# Patient Record
Sex: Female | Born: 1970
Health system: Southern US, Community
[De-identification: ages and names within clinical notes are randomized; demographics above are authoritative.]

## PROBLEM LIST (undated history)

## (undated) DIAGNOSIS — I10 Essential (primary) hypertension: Secondary | ICD-10-CM

## (undated) DIAGNOSIS — E7800 Pure hypercholesterolemia, unspecified: Secondary | ICD-10-CM

## (undated) HISTORY — PX: CHOLECYSTECTOMY: SHX55

## (undated) HISTORY — PX: ABDOMINAL HYSTERECTOMY: SHX81

## (undated) HISTORY — PX: TUBAL LIGATION: SHX77

---

## 2009-11-20 ENCOUNTER — Emergency Department (HOSPITAL_BASED_OUTPATIENT_CLINIC_OR_DEPARTMENT_OTHER): Admission: EM | Admit: 2009-11-20 | Discharge: 2009-11-21 | Payer: Self-pay | Admitting: Emergency Medicine

## 2011-04-02 ENCOUNTER — Emergency Department (HOSPITAL_BASED_OUTPATIENT_CLINIC_OR_DEPARTMENT_OTHER)
Admission: EM | Admit: 2011-04-02 | Discharge: 2011-04-02 | Disposition: A | Discharge status: Home / Self Care | Payer: BC Managed Care – PPO | Attending: Emergency Medicine | Admitting: Emergency Medicine

## 2011-04-02 ENCOUNTER — Encounter (HOSPITAL_BASED_OUTPATIENT_CLINIC_OR_DEPARTMENT_OTHER): Payer: Self-pay | Admitting: *Deleted

## 2011-04-02 ENCOUNTER — Emergency Department (INDEPENDENT_AMBULATORY_CARE_PROVIDER_SITE_OTHER): Payer: BC Managed Care – PPO

## 2011-04-02 DIAGNOSIS — IMO0001 Reserved for inherently not codable concepts without codable children: Secondary | ICD-10-CM | POA: Insufficient documentation

## 2011-04-02 DIAGNOSIS — R52 Pain, unspecified: Secondary | ICD-10-CM

## 2011-04-02 DIAGNOSIS — I10 Essential (primary) hypertension: Secondary | ICD-10-CM | POA: Insufficient documentation

## 2011-04-02 DIAGNOSIS — R5381 Other malaise: Secondary | ICD-10-CM

## 2011-04-02 HISTORY — DX: Essential (primary) hypertension: I10

## 2011-04-02 LAB — BASIC METABOLIC PANEL
BUN: 9 mg/dL (ref 6–23)
Calcium: 10 mg/dL (ref 8.4–10.5)
GFR calc Af Amer: >90 mL/min (ref >90)
GFR calc non Af Amer: >90 mL/min (ref >90)
Glucose, Bld: 96 mg/dL (ref 70–99)
Potassium: 3.8 mEq/L (ref 3.5–5.1)
Sodium: 139 mEq/L (ref 135–145)

## 2011-04-02 LAB — URINALYSIS, ROUTINE W REFLEX MICROSCOPIC
Bilirubin Urine: NEGATIVE
Hgb urine dipstick: NEGATIVE
Nitrite: NEGATIVE
Protein, ur: NEGATIVE mg/dL
Specific Gravity, Urine: 1.016 (ref 1.005–1.030)
Urobilinogen, UA: 0.2 mg/dL (ref 0.0–1.0)

## 2011-04-02 LAB — CBC
Hemoglobin: 13.6 g/dL (ref 12.0–15.0)
MCH: 29.6 pg (ref 26.0–34.0)
MCHC: 33.4 g/dL (ref 30.0–36.0)
RDW: 13.4 % (ref 11.5–15.5)

## 2011-04-02 MED ORDER — HYDROCHLOROTHIAZIDE 25 MG PO TABS: 25.0000 mg | ORAL_TABLET | Freq: Every day | ORAL | Status: DC

## 2011-04-02 MED ORDER — OXYCODONE-ACETAMINOPHEN 5-325 MG PO TABS
1.0000 | ORAL_TABLET | Freq: Once | ORAL | Status: AC
  Administered 2011-04-02: 1 via ORAL
  Filled 2011-04-02: qty 1

## 2011-04-02 NOTE — ED Provider Notes (Signed)
History     CSN: [id number]  Arrival date & time 04/02/11  1950   First MD Initiated Contact with Patient 04/02/11 1959      Chief Complaint  Patient presents with  . Generalized Body Aches  . Joint Swelling    (Consider location/radiation/quality/duration/timing/severity/associated sxs/prior treatment) HPI Comments: Pt states that she has been having generalized myalgias and extremity swelling for the last day:pt states that she has a history of extremity swelling in the past, and it happened when her bp was up:pt states that she hasn't been on bp medications in a months, because her pcp took her off them, because it was normal the last time that she was seen:pt denies cp, fever, n/v/d, cough, dysuria, or sob:pt has not tried anything at  home  The history is provided by the patient. No language interpreter was used.    Past Medical History  Diagnosis Date  . Hypertension     Past Surgical History  Procedure Date  . Tubal ligation   . Abdominal hysterectomy     History reviewed. No pertinent family history.  History  Substance Use Topics  . Smoking status: Never Smoker   . Smokeless tobacco: Not on file  . Alcohol Use: No    OB History    Grav Para Term Preterm Abortions TAB SAB Ect Mult Living                  Review of Systems  All other systems reviewed and are negative.    Allergies  Aspirin  Home Medications  No current outpatient prescriptions on file.  BP 139/89  Pulse 84  Temp(Src) 98.4 F (36.9 C) (Oral)  Resp 16  Ht 4\' 11"  (1.499 m)  Wt 220 lb (99.791 kg)  BMI 44.43 kg/m2  SpO2 100%  Physical Exam  Nursing note and vitals reviewed. Constitutional: She is oriented to person, place, and time. She appears well-developed and well-nourished.  HENT:  Head: Normocephalic and atraumatic.  Right Ear: External ear normal.  Left Ear: External ear normal.  Eyes: Conjunctivae and EOM are normal. Pupils are equal, round, and reactive to light.    Neck: Neck supple.  Cardiovascular: Normal rate and regular rhythm.   Pulmonary/Chest: Effort normal and breath sounds normal.  Abdominal: Soft. Bowel sounds are normal.  Musculoskeletal: Normal range of motion.       Mild swelling noted to hands bilaterally:no lower extremity swelling noted  Neurological: She is alert and oriented to person, place, and time.  Skin: Skin is warm and dry.  Psychiatric: She has a normal mood and affect.    ED Course  Procedures (including critical care time)   Labs Reviewed  CBC  BASIC METABOLIC PANEL  URINALYSIS, ROUTINE W REFLEX MICROSCOPIC  PREGNANCY, URINE   Dg Chest 2 View  04/02/2011  *RADIOLOGY REPORT*  Clinical Data: Bodyaches, myalgia, weakness  CHEST - 2 VIEW  Comparison: None.  Findings: Normal cardiac silhouette and mediastinal contours.  No focal airspace opacities.  No pleural effusion or pneumothorax.  No acute osseous abnormalities.  IMPRESSION: No acute cardiopulmonary disease.  Specifically, no evidence of pneumonia.  Original Report Authenticated By: Waynard Reeds, M.D.     1. Hypertension   2. Body aches       MDM  No acute findings here:will start pt on bp medications        Teressa Lower, NP 04/02/11 2151

## 2011-04-02 NOTE — Discharge Instructions (Signed)
Hypertension Hypertension is another name for high blood pressure. High blood pressure may mean that your heart needs to work harder to pump blood. Blood pressure consists of two numbers, which includes a higher number over a lower number (example: 110/72). HOME CARE   Make lifestyle changes as told by your doctor. This may include weight loss and exercise.   Take your blood pressure medicine every day.   Limit how much salt you use.   Stop smoking if you smoke.   Do not use drugs.   Talk to your doctor if you are using decongestants or birth control pills. These medicines might make blood pressure higher.   Females should not drink more than 1 alcoholic drink per day. Males should not drink more than 2 alcoholic drinks per day.   See your doctor as told.  GET HELP RIGHT AWAY IF:   You have a blood pressure reading with a top number of 180 or higher.   You get a very bad headache.   You get blurred or changing vision.   You feel confused.   You feel weak, numb, or faint.   You get chest or belly (abdominal) pain.   You throw up (vomit).   You cannot breathe very well.  MAKE SURE YOU:   Understand these instructions.   Will watch your condition.   Will get help right away if you are not doing well or get worse.  Document Released: 07/10/2007 Document Revised: 10/03/2010 Document Reviewed: 07/10/2007 [employer] Patient Information 2012 [employer]. 

## 2011-04-02 NOTE — ED Notes (Signed)
Pt c/o generalized body aches and extr swelling x 1 day

## 2011-04-04 NOTE — ED Provider Notes (Signed)
Medical screening examination/treatment/procedure(s) were performed by non-physician practitioner and as supervising physician I was immediately available for consultation/collaboration.  Cyndra Numbers, MD 04/04/11 1000

## 2011-07-06 ENCOUNTER — Encounter (HOSPITAL_BASED_OUTPATIENT_CLINIC_OR_DEPARTMENT_OTHER): Payer: Self-pay | Admitting: *Deleted

## 2011-07-06 ENCOUNTER — Emergency Department (HOSPITAL_BASED_OUTPATIENT_CLINIC_OR_DEPARTMENT_OTHER)
Admission: EM | Admit: 2011-07-06 | Discharge: 2011-07-06 | Disposition: A | Discharge status: Home / Self Care | Payer: BC Managed Care – PPO | Attending: Emergency Medicine | Admitting: Emergency Medicine

## 2011-07-06 ENCOUNTER — Emergency Department (HOSPITAL_BASED_OUTPATIENT_CLINIC_OR_DEPARTMENT_OTHER): Payer: BC Managed Care – PPO

## 2011-07-06 DIAGNOSIS — R609 Edema, unspecified: Secondary | ICD-10-CM | POA: Insufficient documentation

## 2011-07-06 DIAGNOSIS — E669 Obesity, unspecified: Secondary | ICD-10-CM | POA: Insufficient documentation

## 2011-07-06 DIAGNOSIS — M25531 Pain in right wrist: Secondary | ICD-10-CM

## 2011-07-06 DIAGNOSIS — R0602 Shortness of breath: Secondary | ICD-10-CM | POA: Insufficient documentation

## 2011-07-06 DIAGNOSIS — M79609 Pain in unspecified limb: Secondary | ICD-10-CM | POA: Insufficient documentation

## 2011-07-06 DIAGNOSIS — I10 Essential (primary) hypertension: Secondary | ICD-10-CM | POA: Insufficient documentation

## 2011-07-06 DIAGNOSIS — M25539 Pain in unspecified wrist: Secondary | ICD-10-CM | POA: Insufficient documentation

## 2011-07-06 DIAGNOSIS — M79662 Pain in left lower leg: Secondary | ICD-10-CM

## 2011-07-06 LAB — CBC
HCT: 35.9 % — ABNORMAL LOW (ref 36.0–46.0)
MCHC: 34 g/dL (ref 30.0–36.0)
MCV: 89.8 fL (ref 78.0–100.0)
Platelets: 275 10*3/uL (ref 150–400)
RDW: 13.6 % (ref 11.5–15.5)

## 2011-07-06 LAB — URINALYSIS, ROUTINE W REFLEX MICROSCOPIC
Glucose, UA: NEGATIVE mg/dL
Leukocytes, UA: NEGATIVE
Protein, ur: NEGATIVE mg/dL
Specific Gravity, Urine: 1.015 (ref 1.005–1.030)
pH: 6 (ref 5.0–8.0)

## 2011-07-06 LAB — DIFFERENTIAL
Basophils Absolute: 0 10*3/uL (ref 0.0–0.1)
Basophils Relative: 0 % (ref 0–1)
Eosinophils Absolute: 0.1 10*3/uL (ref 0.0–0.7)
Eosinophils Relative: 2 % (ref 0–5)
Monocytes Absolute: 0.4 10*3/uL (ref 0.1–1.0)

## 2011-07-06 LAB — COMPREHENSIVE METABOLIC PANEL
AST: 15 U/L (ref 0–37)
Albumin: 4 g/dL (ref 3.5–5.2)
Calcium: 9.4 mg/dL (ref 8.4–10.5)
Creatinine, Ser: 0.6 mg/dL (ref 0.50–1.10)
GFR calc non Af Amer: >90 mL/min (ref >90)
Sodium: 139 mEq/L (ref 135–145)
Total Protein: 7.3 g/dL (ref 6.0–8.3)

## 2011-07-06 LAB — PRO B NATRIURETIC PEPTIDE: Pro B Natriuretic peptide (BNP): 99.3 pg/mL (ref 0–125)

## 2011-07-06 MED ORDER — MORPHINE SULFATE 4 MG/ML IJ SOLN
4.0000 mg | Freq: Once | INTRAMUSCULAR | Status: AC
  Administered 2011-07-06: 4 mg via INTRAVENOUS

## 2011-07-06 MED ORDER — MORPHINE SULFATE 4 MG/ML IJ SOLN
INTRAMUSCULAR | Status: AC
  Filled 2011-07-06: qty 1

## 2011-07-06 MED ORDER — OXYCODONE-ACETAMINOPHEN 5-325 MG PO TABS
1.0000 | ORAL_TABLET | Freq: Once | ORAL | Status: AC
  Administered 2011-07-06: 1 via ORAL
  Filled 2011-07-06: qty 1

## 2011-07-06 MED ORDER — PREDNISONE 50 MG PO TABS: 50.0000 mg | ORAL_TABLET | Freq: Every day | ORAL | Status: DC

## 2011-07-06 MED ORDER — OXYCODONE-ACETAMINOPHEN 5-325 MG PO TABS: 1.0000 | ORAL_TABLET | ORAL | Status: DC | PRN

## 2011-07-06 MED ORDER — ONDANSETRON HCL 4 MG/2ML IJ SOLN
INTRAMUSCULAR | Status: AC
  Administered 2011-07-06: 4 mg
  Filled 2011-07-06: qty 2

## 2011-07-06 MED ORDER — OXYCODONE-ACETAMINOPHEN 5-325 MG PO TABS: 1.0000 | ORAL_TABLET | ORAL | Status: AC | PRN

## 2011-07-06 MED ORDER — PREDNISONE 50 MG PO TABS
60.0000 mg | ORAL_TABLET | Freq: Once | ORAL | Status: AC
  Administered 2011-07-06: 60 mg via ORAL
  Filled 2011-07-06: qty 1

## 2011-07-06 NOTE — ED Notes (Signed)
Pt reports leg swelling and hand swelling x 1 week- headache today

## 2011-07-06 NOTE — ED Notes (Signed)
Assigned RN at bedside

## 2011-07-06 NOTE — ED Notes (Signed)
Patient transported to Ultrasound via stretcher 

## 2011-07-06 NOTE — Discharge Instructions (Signed)
Your testing did not show an obvious cause for your pain and swelling. I suspect that it may be due 2 a cousin of rheumatoid arthritis or lupus. This will require additional testing which I cannot do from the emergency department. Please make a followup appointment with your Dr. or may do some additional blood tests from the office, or may refer you to a rheumatologist.  Prednisone tablets What is this medicine? PREDNISONE (PRED ni sone) is a corticosteroid. It is commonly used to treat inflammation of the skin, joints, lungs, and other organs. Common conditions treated include asthma, allergies, and arthritis. It is also used for other conditions, such as blood disorders and diseases of the adrenal glands. This medicine may be used for other purposes; ask your health care provider or pharmacist if you have questions. What should I tell my health care provider before I take this medicine? They need to know if you have any of these conditions: -Cushing's syndrome -diabetes -glaucoma -heart disease -high blood pressure -infection (especially a virus infection such as chickenpox, cold sores, or herpes) -kidney disease -liver disease -mental illness -myasthenia gravis -osteoporosis -seizures -stomach or intestine problems -thyroid disease -an unusual or allergic reaction to lactose, prednisone, other medicines, foods, dyes, or preservatives -pregnant or trying to get pregnant -breast-feeding How should I use this medicine? Take this medicine by mouth with a glass of water. Follow the directions on the prescription label. Take this medicine with food. If you are taking this medicine once a day, take it in the morning. Do not take more medicine than you are told to take. Do not suddenly stop taking your medicine because you may develop a severe reaction. Your doctor will tell you how much medicine to take. If your doctor wants you to stop the medicine, the dose may be slowly lowered over time to  avoid any side effects. Talk to your pediatrician regarding the use of this medicine in children. Special care may be needed. Overdosage: If you think you have taken too much of this medicine contact a poison control center or emergency room at once. NOTE: This medicine is only for you. Do not share this medicine with others. What if I miss a dose? If you miss a dose, take it as soon as you can. If it is almost time for your next dose, talk to your doctor or health care professional. You may need to miss a dose or take an extra dose. Do not take double or extra doses without advice. What may interact with this medicine? Do not take this medicine with any of the following medications: -metyrapone -mifepristone This medicine may also interact with the following medications: -aminoglutethimide -amphotericin B -aspirin and aspirin-like medicines -barbiturates -certain medicines for diabetes, like glipizide or glyburide -cholestyramine -cholinesterase inhibitors -cyclosporine -digoxin -diuretics -ephedrine -female hormones, like estrogens and birth control pills -isoniazid -ketoconazole -NSAIDS, medicines for pain and inflammation, like ibuprofen or naproxen -phenytoin -rifampin -toxoids -vaccines -warfarin This list may not describe all possible interactions. Give your health care provider a list of all the medicines, herbs, non-prescription drugs, or dietary supplements you use. Also tell them if you smoke, drink alcohol, or use illegal drugs. Some items may interact with your medicine. What should I watch for while using this medicine? Visit your doctor or health care professional for regular checks on your progress. If you are taking this medicine over a prolonged period, carry an identification card with your name and address, the type and dose of your medicine,  and your doctor's name and address. This medicine may increase your risk of getting an infection. Tell your doctor or  health care professional if you are around anyone with measles or chickenpox, or if you develop sores or blisters that do not heal properly. If you are going to have surgery, tell your doctor or health care professional that you have taken this medicine within the last twelve months. Ask your doctor or health care professional about your diet. You may need to lower the amount of salt you eat. This medicine may affect blood sugar levels. If you have diabetes, check with your doctor or health care professional before you change your diet or the dose of your diabetic medicine. What side effects may I notice from receiving this medicine? Side effects that you should report to your doctor or health care professional as soon as possible: -allergic reactions like skin rash, itching or hives, swelling of the face, lips, or tongue -changes in emotions or moods -changes in vision -depressed mood -eye pain -fever or chills, cough, sore throat, pain or difficulty passing urine -increased thirst -swelling of ankles, feet Side effects that usually do not require medical attention (report to your doctor or health care professional if they continue or are bothersome): -confusion, excitement, restlessness -headache -nausea, vomiting -skin problems, acne, thin and shiny skin -trouble sleeping -weight gain This list may not describe all possible side effects. Call your doctor for medical advice about side effects. You may report side effects to FDA at 1-800-FDA-1088. Where should I keep my medicine? Keep out of the reach of children. Store at room temperature between 15 and 30 degrees C (59 and 86 degrees F). Protect from light. Keep container tightly closed. Throw away any unused medicine after the expiration date. NOTE: This sheet is a summary. It may not cover all possible information. If you have questions about this medicine, talk to your doctor, pharmacist, or health care provider.  2012, [employer]. (09/06/2010 10:57:14 AM)  Acetaminophen; Oxycodone tablets What is this medicine? ACETAMINOPHEN; OXYCODONE (a set a MEE noe fen; ox i KOE done) is a pain reliever. It is used to treat mild to moderate pain. This medicine may be used for other purposes; ask your health care provider or pharmacist if you have questions. What should I tell my health care provider before I take this medicine? They need to know if you have any of these conditions: -brain tumor -Crohn's disease, inflammatory bowel disease, or ulcerative colitis -drink more than 3 alcohol containing drinks per day -drug abuse or addiction -head injury -heart or circulation problems -kidney disease or problems going to the bathroom -liver disease -lung disease, asthma, or breathing problems -an unusual or allergic reaction to acetaminophen, oxycodone, other opioid analgesics, other medicines, foods, dyes, or preservatives -pregnant or trying to get pregnant -breast-feeding How should I use this medicine? Take this medicine by mouth with a full glass of water. Follow the directions on the prescription label. Take your medicine at regular intervals. Do not take your medicine more often than directed. Talk to your pediatrician regarding the use of this medicine in children. Special care may be needed. Patients over 28 years old may have a stronger reaction and need a smaller dose. Overdosage: If you think you have taken too much of this medicine contact a poison control center or emergency room at once. NOTE: This medicine is only for you. Do not share this medicine with others. What if I miss a dose? If  you miss a dose, take it as soon as you can. If it is almost time for your next dose, take only that dose. Do not take double or extra doses. What may interact with this medicine? -alcohol or medicines that contain alcohol -antihistamines -barbiturates like amobarbital, butalbital, butabarbital, methohexital,  pentobarbital, phenobarbital, thiopental, and secobarbital -benztropine -drugs for bladder problems like solifenacin, trospium, oxybutynin, tolterodine, hyoscyamine, and methscopolamine -drugs for breathing problems like ipratropium and tiotropium -drugs for certain stomach or intestine problems like propantheline, homatropine methylbromide, glycopyrrolate, atropine, belladonna, and dicyclomine -general anesthetics like etomidate, ketamine, nitrous oxide, propofol, desflurane, enflurane, halothane, isoflurane, and sevoflurane -medicines for depression, anxiety, or psychotic disturbances -medicines for pain like codeine, morphine, pentazocine, buprenorphine, butorphanol, nalbuphine, tramadol, and propoxyphene -medicines for sleep -muscle relaxants -naltrexone -phenothiazines like perphenazine, thioridazine, chlorpromazine, mesoridazine, fluphenazine, prochlorperazine, promazine, and trifluoperazine -scopolamine -trihexyphenidyl This list may not describe all possible interactions. Give your health care provider a list of all the medicines, herbs, non-prescription drugs, or dietary supplements you use. Also tell them if you smoke, drink alcohol, or use illegal drugs. Some items may interact with your medicine. What should I watch for while using this medicine? Tell your doctor or health care professional if your pain does not go away, if it gets worse, or if you have new or a different type of pain. You may develop tolerance to the medicine. Tolerance means that you will need a higher dose of the medication for pain relief. Tolerance is normal and is expected if you take this medicine for a long time. Do not suddenly stop taking your medicine because you may develop a severe reaction. Your body becomes used to the medicine. This does NOT mean you are addicted. Addiction is a behavior related to getting and using a drug for a nonmedical reason. If you have pain, you have a medical reason to take pain  medicine. Your doctor will tell you how much medicine to take. If your doctor wants you to stop the medicine, the dose will be slowly lowered over time to avoid any side effects. You may get drowsy or dizzy. Do not drive, use machinery, or do anything that needs mental alertness until you know how this medicine affects you. Do not stand or sit up quickly, especially if you are an older patient. This reduces the risk of dizzy or fainting spells. Alcohol may interfere with the effect of this medicine. Avoid alcoholic drinks. The medicine will cause constipation. Try to have a bowel movement at least every 2 to 3 days. If you do not have a bowel movement for 3 days, call your doctor or health care professional. Do not take Tylenol (acetaminophen) or medicines that have acetaminophen with this medicine. Too much acetaminophen can be very dangerous. Many nonprescription medicines contain acetaminophen. Always read the labels carefully to avoid taking more acetaminophen. What side effects may I notice from receiving this medicine? Side effects that you should report to your doctor or health care professional as soon as possible: -allergic reactions like skin rash, itching or hives, swelling of the face, lips, or tongue -breathing difficulties, wheezing -confusion -light headedness or fainting spells -severe stomach pain -yellowing of the skin or the whites of the eyes Side effects that usually do not require medical attention (report to your doctor or health care professional if they continue or are bothersome): -dizziness -drowsiness -nausea -vomiting This list may not describe all possible side effects. Call your doctor for medical advice about side effects. You may report  side effects to FDA at 1-800-FDA-1088. Where should I keep my medicine? Keep out of the reach of children. This medicine can be abused. Keep your medicine in a safe place to protect it from theft. Do not share this medicine with  anyone. Selling or giving away this medicine is dangerous and against the law. Store at room temperature between 20 and 25 degrees C (68 and 77 degrees F). Keep container tightly closed. Protect from light. Flush any unused medicines down the toilet. Do not use the medicine after the expiration date. NOTE: This sheet is a summary. It may not cover all possible information. If you have questions about this medicine, talk to your doctor, pharmacist, or health care provider.  2012, [employer]. (12/21/2007 10:01:21 AM)

## 2011-07-06 NOTE — ED Notes (Signed)
Patient complains of nausea while having ECG done. Assigned RN made aware.

## 2011-07-06 NOTE — ED Provider Notes (Signed)
History   This chart was scribed for Brooke Booze, MD by Brooks Sailors. The patient was seen in room MH08/MH08. Patient's care was started at 1442.   CSN: [id number]  Arrival date & time 07/06/11  1442   First MD Initiated Contact with Patient 07/06/11 1607      Chief Complaint  Patient presents with  . Leg Swelling    (Consider location/radiation/quality/duration/timing/severity/associated sxs/prior treatment) Patient is a 41 y.o. female presenting with leg pain.  Leg Pain  The incident occurred 12 to 24 hours ago. There was no injury mechanism. The pain is present in the left leg. The quality of the pain is described as sharp. The pain is at a severity of 9/10. The pain is moderate. The pain has been constant since onset. She reports no foreign bodies present. The symptoms are aggravated by activity. She has tried nothing for the symptoms. The treatment provided no relief.    Brooke Trujillo is a 41 y.o. female who presents to the Emergency Department complaining of left leg and right hand swelling and pain with associated dull headaches and nausea onset one week ago. Pt came to ER because the pain increased severely last night describing pain as sharp and shooting. Pt says her pain currently is a 9. Pt says she took Tramadol for relief which put her to sleep. Pt says she is SOB for about of month, but thinks it may be her weight. Exertion and talking aggravate SOB. Denies Fever, chills, diaphoresis, Chest pain, smoking. Pt with history of HTN, and allergic to ASA.    Past Medical History  Diagnosis Date  . Hypertension     Past Surgical History  Procedure Date  . Tubal ligation   . Abdominal hysterectomy     No family history on file.  History  Substance Use Topics  . Smoking status: Never Smoker   . Smokeless tobacco: Never Used  . Alcohol Use: No    OB History    Grav Para Term Preterm Abortions TAB SAB Ect Mult Living                  Review of Systems  All  other systems reviewed and are negative.    Allergies  Aspirin  Home Medications   Current Outpatient Rx  Name Route Sig Dispense Refill  . HYDROCHLOROTHIAZIDE 25 MG PO TABS Oral Take 1 tablet (25 mg total) by mouth daily. 30 tablet 0  . TRAMADOL HCL 50 MG PO TABS Oral Take 50-100 mg by mouth every 6 (six) hours as needed. For pain      BP 112/62  Pulse 84  Temp(Src) 98.3 F (36.8 C) (Oral)  Resp 16  Ht 4\' 11"  (1.499 m)  Wt 225 lb (102.059 kg)  BMI 45.44 kg/m2  SpO2 99%  Physical Exam  Nursing note and vitals reviewed. Constitutional: She is oriented to person, place, and time. She appears well-developed and well-nourished. No distress.       obese  HENT:  Head: Normocephalic and atraumatic.  Eyes: EOM are normal. Pupils are equal, round, and reactive to light.  Neck: Neck supple. No tracheal deviation present.  Cardiovascular: Normal rate.   Pulmonary/Chest: Effort normal. No respiratory distress.  Abdominal: Soft. She exhibits no distension.  Musculoskeletal: Normal range of motion. She exhibits edema and tenderness.       1+ pretibial edema BLE. Moderate tenderness to the left calf. No cords, no warmth, no erythema, no difference in calf circumference.  Neurological: She is alert and oriented to person, place, and time. No sensory deficit.       Slight decreased pin prick sensation in right hand.  Skin: Skin is warm and dry.  Psychiatric: She has a normal mood and affect. Her behavior is normal.    ED Course  Procedures (including critical care time) DIAGNOSTIC STUDIES: Oxygen Saturation is 99% on room air, normal by my interpretation.    COORDINATION OF CARE:  1613 Patient informed of current plan for treatment and evaluation and agrees with plan at this time. Pt to have chest x-ray, EKG, UA, blood work, and doppler test in leg. 1835 Pt informed of lab results.    Results for orders placed during the hospital encounter of 07/06/11  CBC      Component  Value Range   WBC 6.2  4.0 - 10.5 (K/uL)   RBC 4.00  3.87 - 5.11 (MIL/uL)   Hemoglobin 12.2  12.0 - 15.0 (g/dL)   HCT 13.0 (*) 86.5 - 46.0 (%)   MCV 89.8  78.0 - 100.0 (fL)   MCH 30.5  26.0 - 34.0 (pg)   MCHC 34.0  30.0 - 36.0 (g/dL)   RDW 78.4  69.6 - 29.5 (%)   Platelets 275  150 - 400 (K/uL)  DIFFERENTIAL      Component Value Range   Neutrophils Relative 46  43 - 77 (%)   Neutro Abs 2.8  1.7 - 7.7 (K/uL)   Lymphocytes Relative 45  12 - 46 (%)   Lymphs Abs 2.8  0.7 - 4.0 (K/uL)   Monocytes Relative 7  3 - 12 (%)   Monocytes Absolute 0.4  0.1 - 1.0 (K/uL)   Eosinophils Relative 2  0 - 5 (%)   Eosinophils Absolute 0.1  0.0 - 0.7 (K/uL)   Basophils Relative 0  0 - 1 (%)   Basophils Absolute 0.0  0.0 - 0.1 (K/uL)  COMPREHENSIVE METABOLIC PANEL      Component Value Range   Sodium 139  135 - 145 (mEq/L)   Potassium 3.5  3.5 - 5.1 (mEq/L)   Chloride 102  96 - 112 (mEq/L)   CO2 28  19 - 32 (mEq/L)   Glucose, Bld 92  70 - 99 (mg/dL)   BUN 9  6 - 23 (mg/dL)   Creatinine, Ser 2.84  0.50 - 1.10 (mg/dL)   Calcium 9.4  8.4 - 13.2 (mg/dL)   Total Protein 7.3  6.0 - 8.3 (g/dL)   Albumin 4.0  3.5 - 5.2 (g/dL)   AST 15  0 - 37 (U/L)   ALT 14  0 - 35 (U/L)   Alkaline Phosphatase 64  39 - 117 (U/L)   Total Bilirubin 0.3  0.3 - 1.2 (mg/dL)   GFR calc non Af Amer >90  >90 (mL/min)   GFR calc Af Amer >90  >90 (mL/min)  SEDIMENTATION RATE      Component Value Range   Sed Rate 38 (*) 0 - 22 (mm/hr)  URINALYSIS, ROUTINE W REFLEX MICROSCOPIC      Component Value Range   Color, Urine YELLOW  YELLOW    APPearance CLEAR  CLEAR    Specific Gravity, Urine 1.015  1.005 - 1.030    pH 6.0  5.0 - 8.0    Glucose, UA NEGATIVE  NEGATIVE (mg/dL)   Hgb urine dipstick NEGATIVE  NEGATIVE    Bilirubin Urine NEGATIVE  NEGATIVE    Ketones, ur NEGATIVE  NEGATIVE (mg/dL)  Protein, ur NEGATIVE  NEGATIVE (mg/dL)   Urobilinogen, UA 0.2  0.0 - 1.0 (mg/dL)   Nitrite NEGATIVE  NEGATIVE    Leukocytes, UA  NEGATIVE  NEGATIVE   PRO B NATRIURETIC PEPTIDE      Component Value Range   Pro B Natriuretic peptide (BNP) 99.3  0 - 125 (pg/mL)   Dg Chest 2 View  07/06/2011  *RADIOLOGY REPORT*  Clinical Data: Leg swelling.  Shortness of breath.  CHEST - 2 VIEW  Comparison: 04/04/2011  Findings: Heart and mediastinal contours are within normal limits. No focal opacities or effusions.  No acute bony abnormality.  IMPRESSION: No active cardiopulmonary disease.  Original Report Authenticated By: Cyndie Chime, M.D.   US Venous Img Lower Unilateral Left  07/06/2011  *RADIOLOGY REPORT*  Clinical Data: Left leg pain and swelling.  LEFT LOWER EXTREMITY VENOUS DUPLEX ULTRASOUND  Technique:  Gray-scale sonography with graded compression, as well as color Doppler and duplex ultrasound were performed to evaluate the deep venous system of the lower extremity from the level of the common femoral vein through the popliteal and proximal calf veins. Spectral Doppler was utilized to evaluate flow at rest and with distal augmentation maneuvers.  Comparison:  None.  Findings:  Normal compressibility of the common femoral, superficial femoral, and popliteal veins is demonstrated, as well as the visualized proximal calf veins.  No filling defects to suggest DVT on grayscale or color Doppler imaging.  Doppler waveforms show normal direction of venous flow, normal respiratory phasicity and response to augmentation.  IMPRESSION: No evidence of left lower extremity deep vein thrombosis.  Original Report Authenticated By: Darrol Angel, M.D.      ECG shows normal sinus rhythm with a rate of 75, no ectopy. Normal axis. Normal P wave. Normal QRS. Normal intervals. Normal ST and T waves. Impression: normal ECG. No old ECG available for comparison.   1. Pain, wrist, right   2. Pain of left calf   3. Edema       MDM  Confusing history. She will be sent for Doppler testing to rule out DVT. However with these complaints, I suspect that  she probably has some variation on an autoimmune disorder. Screening labs have been ordered. With the history of dyspnea on exertion, chest x-ray and ECG and BNP he will be ordered.  Laboratory workup is essentially unremarkable and Doppler study is negative for DVT. She'll be given in. Course of prednisone, given Percocet for pain control, and referred back to her PCP for further evaluation.    I personally performed the services described in this documentation, which was scribed in my presence. The recorded information has been reviewed and considered.      Brooke Booze, MD 07/06/11 [id number]

## 2011-07-06 NOTE — ED Notes (Signed)
Family at bedside. 

## 2011-08-23 DIAGNOSIS — K219 Gastro-esophageal reflux disease without esophagitis: Secondary | ICD-10-CM | POA: Insufficient documentation

## 2011-11-14 ENCOUNTER — Encounter (HOSPITAL_BASED_OUTPATIENT_CLINIC_OR_DEPARTMENT_OTHER): Payer: Self-pay | Admitting: *Deleted

## 2011-11-14 ENCOUNTER — Emergency Department (HOSPITAL_BASED_OUTPATIENT_CLINIC_OR_DEPARTMENT_OTHER): Payer: BC Managed Care – PPO

## 2011-11-14 ENCOUNTER — Emergency Department (HOSPITAL_BASED_OUTPATIENT_CLINIC_OR_DEPARTMENT_OTHER)
Admission: EM | Admit: 2011-11-14 | Discharge: 2011-11-14 | Disposition: A | Discharge status: Home / Self Care | Payer: BC Managed Care – PPO | Attending: Emergency Medicine | Admitting: Emergency Medicine

## 2011-11-14 DIAGNOSIS — H53149 Visual discomfort, unspecified: Secondary | ICD-10-CM | POA: Insufficient documentation

## 2011-11-14 DIAGNOSIS — R110 Nausea: Secondary | ICD-10-CM | POA: Insufficient documentation

## 2011-11-14 DIAGNOSIS — IMO0002 Reserved for concepts with insufficient information to code with codable children: Secondary | ICD-10-CM | POA: Insufficient documentation

## 2011-11-14 DIAGNOSIS — Z79899 Other long term (current) drug therapy: Secondary | ICD-10-CM | POA: Insufficient documentation

## 2011-11-14 DIAGNOSIS — I10 Essential (primary) hypertension: Secondary | ICD-10-CM | POA: Insufficient documentation

## 2011-11-14 DIAGNOSIS — R51 Headache: Secondary | ICD-10-CM

## 2011-11-14 MED ORDER — METOCLOPRAMIDE HCL 5 MG/ML IJ SOLN
10.0000 mg | Freq: Once | INTRAMUSCULAR | Status: AC
  Administered 2011-11-14: 10 mg via INTRAMUSCULAR
  Filled 2011-11-14: qty 2

## 2011-11-14 MED ORDER — KETOROLAC TROMETHAMINE 60 MG/2ML IM SOLN
60.0000 mg | Freq: Once | INTRAMUSCULAR | Status: AC
  Administered 2011-11-14: 60 mg via INTRAMUSCULAR
  Filled 2011-11-14: qty 2

## 2011-11-14 MED ORDER — DIPHENHYDRAMINE HCL 50 MG/ML IJ SOLN
25.0000 mg | Freq: Once | INTRAMUSCULAR | Status: AC
  Administered 2011-11-14: 50 mg via INTRAMUSCULAR
  Filled 2011-11-14: qty 1

## 2011-11-14 NOTE — ED Provider Notes (Signed)
History     CSN: [id number]  Arrival date & time 11/14/11  1719   First MD Initiated Contact with Patient 11/14/11 1840      Chief Complaint  Patient presents with  . Headache    (Consider location/radiation/quality/duration/timing/severity/associated sxs/prior treatment) HPI Comments: Patient presents with gradual onset headache that started this morning as become more severe throughout the day. Is associated with facial pain, blurry vision, photophobia, nausea. She has a history of headaches and this is similar. She denies history of migraine. She denies thunderclap onset, vomiting, chills, fever. No weakness, numbness or tingling. She denies excessive salivation, tearing or rhinorrhea. She took ibuprofen without relief.  The history is provided by the patient.    Past Medical History  Diagnosis Date  . Hypertension     Past Surgical History  Procedure Date  . Tubal ligation   . Abdominal hysterectomy     No family history on file.  History  Substance Use Topics  . Smoking status: Never Smoker   . Smokeless tobacco: Never Used  . Alcohol Use: No    OB History    Grav Para Term Preterm Abortions TAB SAB Ect Mult Living                  Review of Systems  Constitutional: Negative for fever, activity change and appetite change.  HENT: Negative for rhinorrhea.   Eyes: Positive for photophobia. Negative for visual disturbance.  Respiratory: Negative for cough, chest tightness and shortness of breath.   Cardiovascular: Negative for chest pain.  Gastrointestinal: Positive for nausea. Negative for vomiting and abdominal pain.  Genitourinary: Negative for dysuria and hematuria.  Musculoskeletal: Negative for back pain.  Skin: Negative for wound.  Neurological: Positive for headaches. Negative for dizziness.    Allergies  Aspirin  Home Medications   Current Outpatient Rx  Name Route Sig Dispense Refill  . HYDROCHLOROTHIAZIDE 25 MG PO TABS Oral Take 1 tablet  (25 mg total) by mouth daily. 30 tablet 0  . PREDNISONE 50 MG PO TABS Oral Take 1 tablet (50 mg total) by mouth daily. 5 tablet 0  . TRAMADOL HCL 50 MG PO TABS Oral Take 50-100 mg by mouth every 6 (six) hours as needed. For pain      BP 165/103  Pulse 93  Temp 98 F (36.7 C) (Oral)  Resp 20  SpO2 100%  Physical Exam  Constitutional: She is oriented to person, place, and time. She appears well-developed and well-nourished. No distress.  HENT:  Head: Normocephalic and atraumatic.  Mouth/Throat: Oropharynx is clear and moist. No oropharyngeal exudate.       No sinus tenderness  Eyes: Conjunctivae normal and EOM are normal. Pupils are equal, round, and reactive to light.       Photophobic, no appreciable papilledema  Neck: Normal range of motion. Neck supple.       No meningismus  Cardiovascular: Normal rate, regular rhythm and normal heart sounds.   No murmur heard. Pulmonary/Chest: Effort normal and breath sounds normal. No respiratory distress.  Abdominal: Soft. There is no tenderness. There is no rebound and no guarding.  Musculoskeletal: Normal range of motion. She exhibits no edema and no tenderness.  Neurological: She is alert and oriented to person, place, and time. No cranial nerve deficit.       5 Out of 5 strength throughout, no ataxia finger to nose.  Skin: Skin is warm.    ED Course  Procedures (including critical care time)  Labs  Reviewed - No data to display No results found.   No diagnosis found.    MDM  Gradual onset headache with nausea and photophobia. She previous headaches. No thunderclap onset. No neurological deficits.  Headache improved with migraine cocktail. Patient states her vision is back to normal. Denies any facial pain or headache. She feels ready to go home.      Glynn Octave, MD 11/14/11 2012

## 2011-11-14 NOTE — ED Notes (Signed)
Headache at work. Facial pain.

## 2011-12-04 DIAGNOSIS — E669 Obesity, unspecified: Secondary | ICD-10-CM | POA: Insufficient documentation

## 2012-03-25 ENCOUNTER — Emergency Department (HOSPITAL_COMMUNITY): Payer: BC Managed Care – PPO

## 2012-03-25 ENCOUNTER — Emergency Department (HOSPITAL_COMMUNITY)
Admission: EM | Admit: 2012-03-25 | Discharge: 2012-03-25 | Disposition: A | Discharge status: Home / Self Care | Payer: BC Managed Care – PPO | Attending: Emergency Medicine | Admitting: Emergency Medicine

## 2012-03-25 ENCOUNTER — Encounter (HOSPITAL_COMMUNITY): Payer: Self-pay

## 2012-03-25 DIAGNOSIS — R51 Headache: Secondary | ICD-10-CM | POA: Insufficient documentation

## 2012-03-25 DIAGNOSIS — E669 Obesity, unspecified: Secondary | ICD-10-CM | POA: Insufficient documentation

## 2012-03-25 DIAGNOSIS — I10 Essential (primary) hypertension: Secondary | ICD-10-CM | POA: Insufficient documentation

## 2012-03-25 DIAGNOSIS — R0602 Shortness of breath: Secondary | ICD-10-CM | POA: Insufficient documentation

## 2012-03-25 DIAGNOSIS — Z79899 Other long term (current) drug therapy: Secondary | ICD-10-CM | POA: Insufficient documentation

## 2012-03-25 LAB — BASIC METABOLIC PANEL
GFR calc Af Amer: >90 mL/min (ref >90)
GFR calc non Af Amer: >90 mL/min (ref >90)
Glucose, Bld: 107 mg/dL — ABNORMAL HIGH (ref 70–99)
Potassium: 3.9 mEq/L (ref 3.5–5.1)
Sodium: 139 mEq/L (ref 135–145)

## 2012-03-25 LAB — CBC WITH DIFFERENTIAL/PLATELET
Eosinophils Absolute: 0.2 10*3/uL (ref 0.0–0.7)
Lymphs Abs: 2 10*3/uL (ref 0.7–4.0)
MCH: 29.8 pg (ref 26.0–34.0)
Neutrophils Relative %: 43 % (ref 43–77)
Platelets: 283 10*3/uL (ref 150–400)
RBC: 3.96 MIL/uL (ref 3.87–5.11)
WBC: 4.7 10*3/uL (ref 4.0–10.5)

## 2012-03-25 LAB — POCT I-STAT TROPONIN I: Troponin i, poc: 0 ng/mL (ref 0.00–0.08)

## 2012-03-25 LAB — PRO B NATRIURETIC PEPTIDE: Pro B Natriuretic peptide (BNP): 32 pg/mL (ref 0–125)

## 2012-03-25 MED ORDER — ACETAMINOPHEN 325 MG PO TABS
650.0000 mg | ORAL_TABLET | Freq: Once | ORAL | Status: AC
  Administered 2012-03-25: 650 mg via ORAL
  Filled 2012-03-25: qty 2

## 2012-03-25 NOTE — ED Provider Notes (Signed)
History    CSN: [id number] Arrival date & time 03/25/12  0714 First MD Initiated Contact with Patient 03/25/12 0715      CC: Shortness of breath  HPI Comments: Pt woke up this morning with a headache as well as a feeling of shortness of breath.  The headache is moderate.  No photophobia.  Some discomfort down to her left neck.  No vomiting.   Patient is a 42 y.o. female presenting with shortness of breath.  Shortness of Breath Severity:  Moderate Onset quality:  Gradual Duration:  1 day Timing:  Constant Chronicity:  New Context: not smoke exposure and not URI   Context comment:  Pt noticed it yesterday and when she woke up this morning. Relieved by:  Nothing Worsened by:  Activity (Did not get worse last night with sleeping and lying flat.) Ineffective treatments:  None tried Associated symptoms: headaches   Associated symptoms: no abdominal pain, no chest pain, no cough, no diaphoresis, no fever, no sore throat, no sputum production and no vomiting   Associated symptoms comment:  She feels like she cannot catch her breath and has a sensation in her chest but no pain.  Hands feel swollen. Risk factors: obesity   Risk factors: no hx of PE/DVT and no tobacco use   Risk factors comment:  No history of heart disease.   Past Medical History  Diagnosis Date  . Hypertension     Past Surgical History  Procedure Laterality Date  . Tubal ligation    . Abdominal hysterectomy      No family history on file.  History  Substance Use Topics  . Smoking status: Never Smoker   . Smokeless tobacco: Never Used  . Alcohol Use: No    OB History   Grav Para Term Preterm Abortions TAB SAB Ect Mult Living                  Review of Systems  Constitutional: Negative for fever and diaphoresis.  HENT: Negative for sore throat.   Respiratory: Positive for shortness of breath. Negative for cough and sputum production.   Cardiovascular: Negative for chest pain.  Gastrointestinal:  Negative for vomiting and abdominal pain.  Neurological: Positive for headaches.  All other systems reviewed and are negative.    Allergies  Aspirin  Home Medications   Current Outpatient Rx  Name  Route  Sig  Dispense  Refill  . hydrochlorothiazide (HYDRODIURIL) 25 MG tablet   Oral   Take 1 tablet (25 mg total) by mouth daily.   30 tablet   0     BP 140/127  Pulse 92  Temp(Src) 97.9 F (36.6 C) (Oral)  Ht 4\' 11"  (1.499 m)  Wt 240 lb (108.863 kg)  BMI 48.45 kg/m2  SpO2 99%  Physical Exam  Nursing note and vitals reviewed. Constitutional: No distress.  Obese   HENT:  Head: Normocephalic and atraumatic.  Right Ear: External ear normal.  Left Ear: External ear normal.  Mouth/Throat: No oropharyngeal exudate.  Eyes: Conjunctivae are normal. Right eye exhibits no discharge. Left eye exhibits no discharge. No scleral icterus.  Neck: Normal range of motion. Neck supple. No tracheal deviation present.  Cardiovascular: Normal rate, regular rhythm and intact distal pulses.   Pulmonary/Chest: Effort normal and breath sounds normal. No stridor. No respiratory distress. She has no wheezes. She has no rales.  Abdominal: Soft. Bowel sounds are normal. She exhibits no distension. There is no tenderness. There is no rebound and no  guarding.  Musculoskeletal: She exhibits no tenderness.  Difficult to assess if there is edema in her hands versus adipose tissue, no pitting edema lower extrem   Neurological: She is alert. She has normal strength. No sensory deficit. Cranial nerve deficit:  no gross defecits noted. She exhibits normal muscle tone. She displays no seizure activity. Coordination normal.  Skin: Skin is warm and dry. No rash noted. She is not diaphoretic.  Psychiatric: She has a normal mood and affect.    ED Course  Procedures (including critical care time) Old records reviewed.  Prior visit to ED for headaches EKG Normal sinus rhythm, rate 88 Probable left atrial  abnormality Normal axis, normal intervals No acute ST-T wave changes EKG not significantly changed from 06 July 2011  Labs Reviewed  CBC WITH DIFFERENTIAL - Abnormal; Notable for the following:    Hemoglobin 11.8 (*)    HCT 35.5 (*)    All other components within normal limits  BASIC METABOLIC PANEL - Abnormal; Notable for the following:    Glucose, Bld 107 (*)    All other components within normal limits  PRO B NATRIURETIC PEPTIDE  POCT I-STAT TROPONIN I   Dg Chest 2 View  03/25/2012  *RADIOLOGY REPORT*  Clinical Data: Chest pain, pressure.  CHEST - 2 VIEW  Comparison: 07/06/2011  Findings: Heart is upper limits normal in size.  Lungs are clear. No effusions or acute bony abnormality.  IMPRESSION: No acute cardiopulmonary disease.   Original Report Authenticated By: Charlett Nose, M.D.      1. Headache       MDM  The patient has no evidence of pneumonia or pulmonary edema. She initially had an elevated blood pressure but that improved with Tylenol for her headache.  I doubt acute coronary syndrome or pulmonary embolism. The patient's symptoms may be related to a possible viral illness.  Will have her take Tylenol as needed and have her follow up with her primary doctor. Warning signs discussed that she couldn't return to emergency department        Celene Kras, MD 03/25/12 1000

## 2012-03-25 NOTE — ED Notes (Signed)
Pt d/c'd from continuous pulse oximetry and blood pressure cuff; pt getting dressed to be discharged home 

## 2012-03-25 NOTE — ED Notes (Addendum)
Headache, neck pain tickle throat. Sob   Symptoms began today.   Pt,. Is NAD, pt. Also reports that her hands are swollen and puffy.

## 2012-07-10 ENCOUNTER — Emergency Department (HOSPITAL_BASED_OUTPATIENT_CLINIC_OR_DEPARTMENT_OTHER): Payer: BC Managed Care – PPO

## 2012-07-10 ENCOUNTER — Encounter (HOSPITAL_BASED_OUTPATIENT_CLINIC_OR_DEPARTMENT_OTHER): Payer: Self-pay | Admitting: *Deleted

## 2012-07-10 ENCOUNTER — Emergency Department (HOSPITAL_BASED_OUTPATIENT_CLINIC_OR_DEPARTMENT_OTHER)
Admission: EM | Admit: 2012-07-10 | Discharge: 2012-07-10 | Disposition: A | Discharge status: Home / Self Care | Payer: BC Managed Care – PPO | Attending: Emergency Medicine | Admitting: Emergency Medicine

## 2012-07-10 DIAGNOSIS — Z79899 Other long term (current) drug therapy: Secondary | ICD-10-CM | POA: Insufficient documentation

## 2012-07-10 DIAGNOSIS — Z8639 Personal history of other endocrine, nutritional and metabolic disease: Secondary | ICD-10-CM | POA: Insufficient documentation

## 2012-07-10 DIAGNOSIS — R079 Chest pain, unspecified: Secondary | ICD-10-CM

## 2012-07-10 DIAGNOSIS — R209 Unspecified disturbances of skin sensation: Secondary | ICD-10-CM | POA: Insufficient documentation

## 2012-07-10 DIAGNOSIS — I10 Essential (primary) hypertension: Secondary | ICD-10-CM | POA: Insufficient documentation

## 2012-07-10 DIAGNOSIS — Z862 Personal history of diseases of the blood and blood-forming organs and certain disorders involving the immune mechanism: Secondary | ICD-10-CM | POA: Insufficient documentation

## 2012-07-10 HISTORY — DX: Pure hypercholesterolemia, unspecified: E78.00

## 2012-07-10 LAB — COMPREHENSIVE METABOLIC PANEL
ALT: 14 U/L (ref 0–35)
AST: 15 U/L (ref 0–37)
Calcium: 9.6 mg/dL (ref 8.4–10.5)
Creatinine, Ser: 0.8 mg/dL (ref 0.50–1.10)
GFR calc Af Amer: >90 mL/min (ref >90)
Sodium: 140 mEq/L (ref 135–145)
Total Protein: 7 g/dL (ref 6.0–8.3)

## 2012-07-10 LAB — TROPONIN I: Troponin I: <0.3 ng/mL (ref <0.30)

## 2012-07-10 LAB — CBC WITH DIFFERENTIAL/PLATELET
Basophils Absolute: 0 10*3/uL (ref 0.0–0.1)
Eosinophils Absolute: 0.2 10*3/uL (ref 0.0–0.7)
Eosinophils Relative: 2 % (ref 0–5)
MCH: 30.4 pg (ref 26.0–34.0)
MCHC: 33.2 g/dL (ref 30.0–36.0)
MCV: 91.5 fL (ref 78.0–100.0)
Platelets: 287 10*3/uL (ref 150–400)
RDW: 13.4 % (ref 11.5–15.5)

## 2012-07-10 MED ORDER — ACETAMINOPHEN 325 MG PO TABS
ORAL_TABLET | ORAL | Status: AC
  Administered 2012-07-10: 650 mg
  Filled 2012-07-10: qty 2

## 2012-07-10 MED ORDER — MORPHINE SULFATE 4 MG/ML IJ SOLN
4.0000 mg | Freq: Once | INTRAMUSCULAR | Status: AC
  Administered 2012-07-10: 4 mg via INTRAVENOUS
  Filled 2012-07-10: qty 1

## 2012-07-10 MED ORDER — IOHEXOL 350 MG/ML SOLN
100.0000 mL | Freq: Once | INTRAVENOUS | Status: AC | PRN
  Administered 2012-07-10: 100 mL via INTRAVENOUS

## 2012-07-10 MED ORDER — GI COCKTAIL ~~LOC~~
30.0000 mL | Freq: Once | ORAL | Status: AC
  Administered 2012-07-10: 30 mL via ORAL
  Filled 2012-07-10: qty 30

## 2012-07-10 MED ORDER — OMEPRAZOLE 20 MG PO CPDR: 40.0000 mg | DELAYED_RELEASE_CAPSULE | Freq: Every day | ORAL | Status: DC

## 2012-07-10 MED ORDER — HYDROCODONE-ACETAMINOPHEN 5-325 MG PO TABS: 2.0000 | ORAL_TABLET | ORAL | Status: DC | PRN

## 2012-07-10 NOTE — ED Notes (Signed)
Karen Sofia, PA-C at bedside 

## 2012-07-10 NOTE — ED Notes (Signed)
Sob x 1 hour. States her right arm and left leg are numb. Chest tightness. Drove herself here.

## 2012-07-10 NOTE — ED Provider Notes (Signed)
History     CSN: [id number]  Arrival date & time 07/10/12  1325   First MD Initiated Contact with Patient 07/10/12 1409      Chief Complaint  Patient presents with  . Shortness of Breath    (Consider location/radiation/quality/duration/timing/severity/associated sxs/prior treatment) Patient is a 42 y.o. female presenting with chest pain. The history is provided by the patient. No language interpreter was used.  Chest Pain Pain location:  Epigastric Pain quality: aching   Pain radiates to:  Does not radiate Pain radiates to the back: no   Pain severity:  Moderate Pt also complains of numbness in both hands and both feet.  (Pt reports she was breathing hard)  Pt reports hands feel swollen today.   Pt reports she had  A stress test recently that was normal.  Pt denies sweating  Past Medical History  Diagnosis Date  . Hypertension   . High cholesterol     Past Surgical History  Procedure Laterality Date  . Tubal ligation    . Abdominal hysterectomy      No family history on file.  History  Substance Use Topics  . Smoking status: Never Smoker   . Smokeless tobacco: Never Used  . Alcohol Use: No    OB History   Grav Para Term Preterm Abortions TAB SAB Ect Mult Living                  Review of Systems  Cardiovascular: Positive for chest pain.  All other systems reviewed and are negative.    Allergies  Aspirin  Home Medications   Current Outpatient Rx  Name  Route  Sig  Dispense  Refill  . LISINOPRIL PO   Oral   Take by mouth.         . EXPIRED: hydrochlorothiazide (HYDRODIURIL) 25 MG tablet   Oral   Take 1 tablet (25 mg total) by mouth daily.   30 tablet   0     BP 141/87  Pulse 81  Temp(Src) 98.7 F (37.1 C) (Oral)  Resp 18  Wt 240 lb (108.863 kg)  BMI 48.45 kg/m2  SpO2 100%  Physical Exam  Nursing note and vitals reviewed. Constitutional: She appears well-developed and well-nourished.  HENT:  Head: Normocephalic and atraumatic.   Right Ear: External ear normal.  Left Ear: External ear normal.  Nose: Nose normal.  Mouth/Throat: Oropharynx is clear and moist.  Eyes: Conjunctivae and EOM are normal. Pupils are equal, round, and reactive to light.  Neck: Normal range of motion.  Cardiovascular: Normal rate and normal heart sounds.   Pulmonary/Chest: Effort normal.  Abdominal: Soft.  Musculoskeletal: Normal range of motion.  Neurological: She is alert.  Skin: Skin is warm.    ED Course  Procedures (including critical care time)  Labs Reviewed  D-DIMER, QUANTITATIVE - Abnormal; Notable for the following:    D-Dimer, Quant 0.73 (*)    All other components within normal limits  CBC WITH DIFFERENTIAL - Abnormal; Notable for the following:    RBC 3.78 (*)    Hemoglobin 11.5 (*)    HCT 34.6 (*)    All other components within normal limits  COMPREHENSIVE METABOLIC PANEL - Abnormal; Notable for the following:    Total Bilirubin 0.2 (*)    GFR calc non Af Amer 89 (*)    All other components within normal limits  TROPONIN I  LIPASE, BLOOD   Dg Chest 2 View  07/10/2012   *RADIOLOGY REPORT*  Clinical  Data: Mid chest pain with shortness of breath.  Nonsmoker  CHEST - 2 VIEW  Comparison: 03/25/2012  Findings: Heart and mediastinal contours are within normal limits. The lung fields remain clear with no signs of focal infiltrate or congestive failure.  No pleural fluid or significant peribronchial cuffing is identified.  Bony structures appear intact.  IMPRESSION: Stable cardiopulmonary appearance with no worrisome focal or acute abnormality identified.   Original Report Authenticated By: Rhodia Albright, M.D.     1. Nonspecific chest pain      Date: 07/10/2012  Rate: 84  Rhythm: normal sinus rhythm  QRS Axis: normal  Intervals: normal  ST/T Wave abnormalities: normal  Conduction Disutrbances:first-degree A-V block   Narrative Interpretation:   Old EKG Reviewed: none available   MDM  Pt had slight elevation  in ddimer,  Ct angio shows no evidence of pe.  EKg is normal,  Troponin is negative.   Pt reports pain relief with gi cocktail,  Pt' numbness resolved and I suspect is related to hyperventilation. 3 hour troponin is negative.   I doubt cardiac pain.   Most likely reflux/gi.  I will treat with prilosec.  I advised follow up with primary care MD next week        Elson Areas, PA-C 07/10/12 2126

## 2012-07-10 NOTE — ED Notes (Signed)
Patient transported to CT 

## 2012-07-10 NOTE — ED Notes (Signed)
Pt placed on heart monitor.

## 2012-07-11 NOTE — ED Provider Notes (Signed)
Medical screening examination/treatment/procedure(s) were performed by non-physician practitioner and as supervising physician I was immediately available for consultation/collaboration.    Emerly Prak J. Samuel Rittenhouse, MD 07/11/12 1554 

## 2012-12-21 DIAGNOSIS — G43009 Migraine without aura, not intractable, without status migrainosus: Secondary | ICD-10-CM | POA: Insufficient documentation

## 2012-12-21 DIAGNOSIS — G479 Sleep disorder, unspecified: Secondary | ICD-10-CM | POA: Insufficient documentation

## 2013-04-14 DIAGNOSIS — E785 Hyperlipidemia, unspecified: Secondary | ICD-10-CM | POA: Insufficient documentation

## 2013-04-14 DIAGNOSIS — E559 Vitamin D deficiency, unspecified: Secondary | ICD-10-CM | POA: Insufficient documentation

## 2013-04-14 DIAGNOSIS — I10 Essential (primary) hypertension: Secondary | ICD-10-CM | POA: Insufficient documentation

## 2013-06-10 IMAGING — CR DG CHEST 2V
2 series · 2 of 2 positions shown · non-contrast
Comparison: 07/06/2011

CLINICAL DATA: Chest pain, pressure.

CHEST - 2 VIEW

[w chest pa]
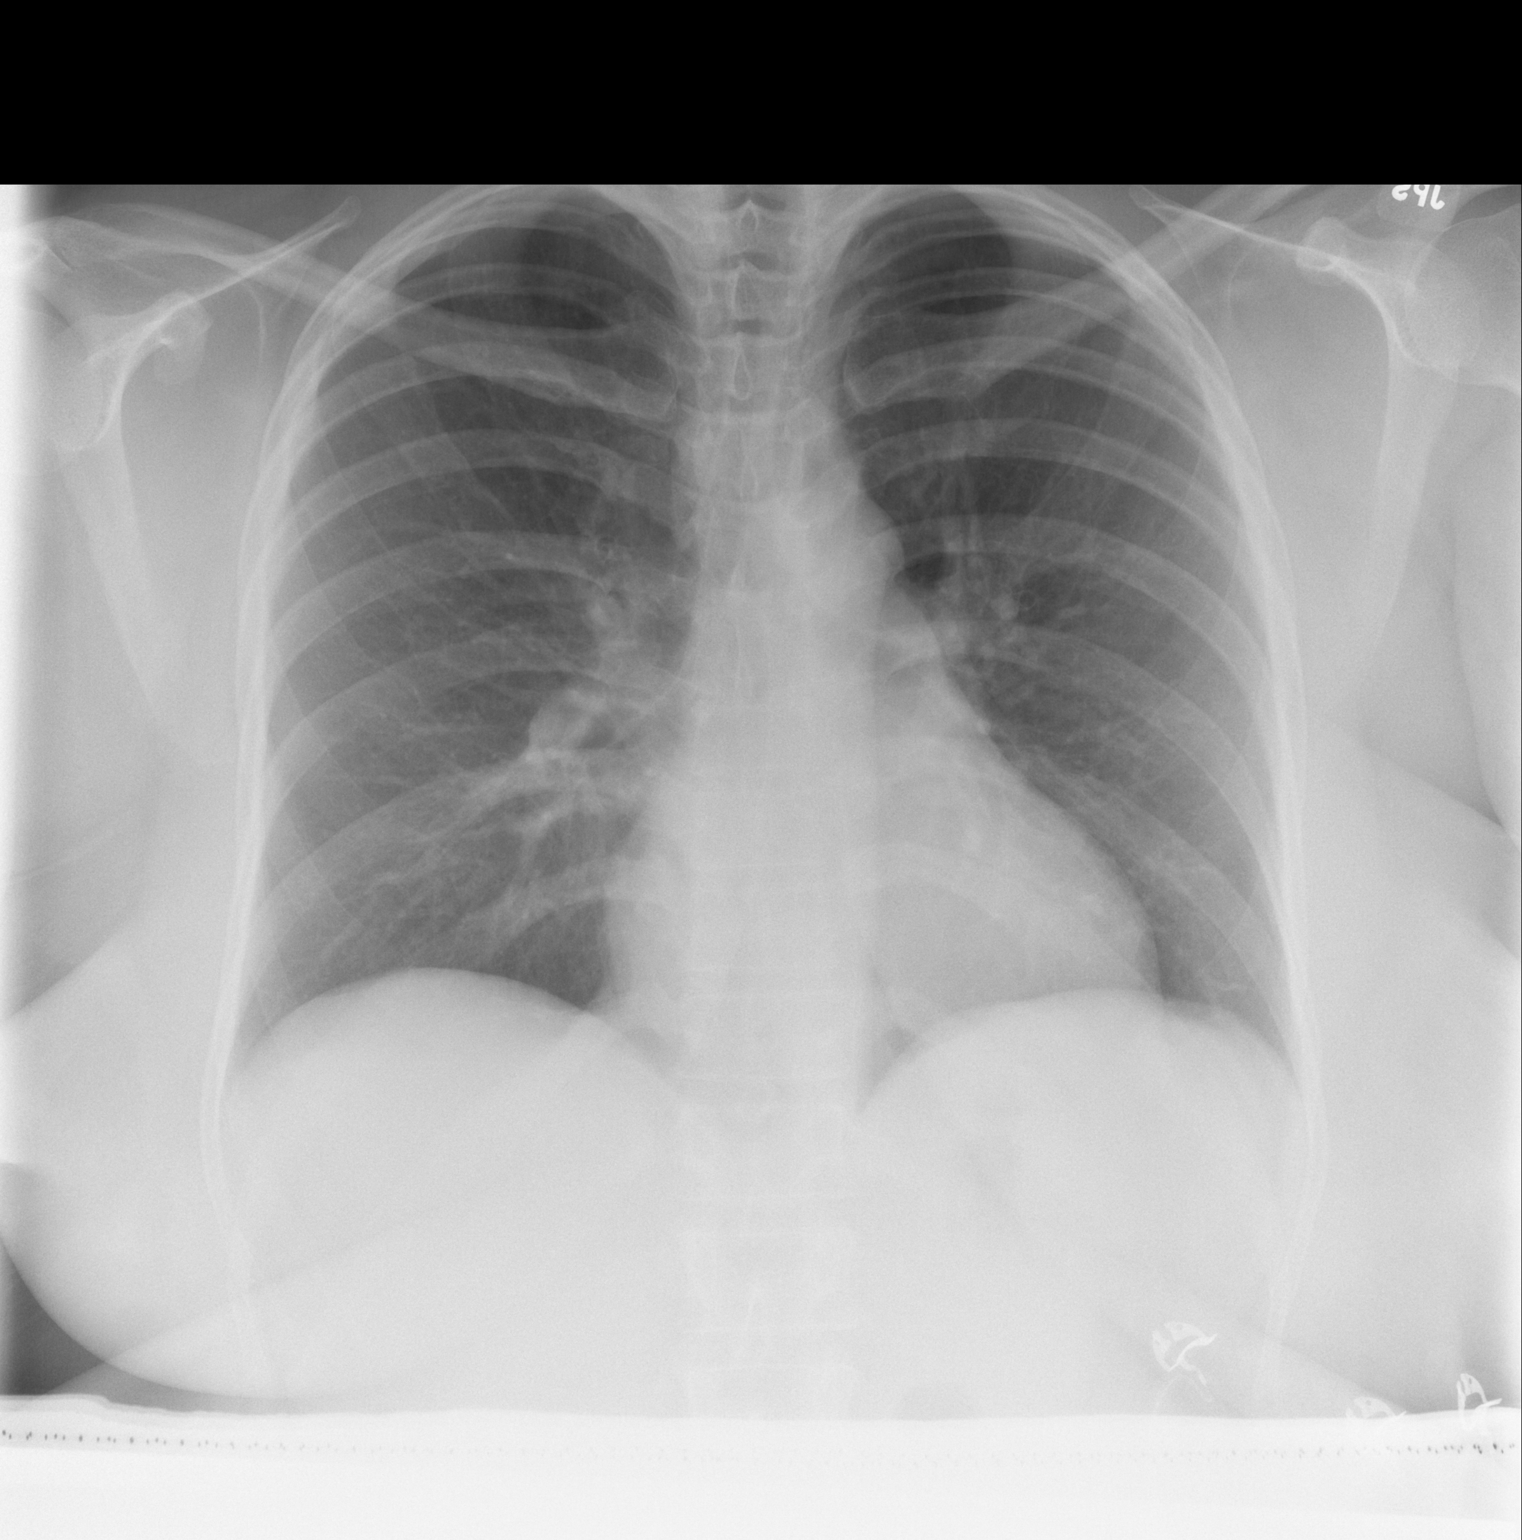

[w chest lat]
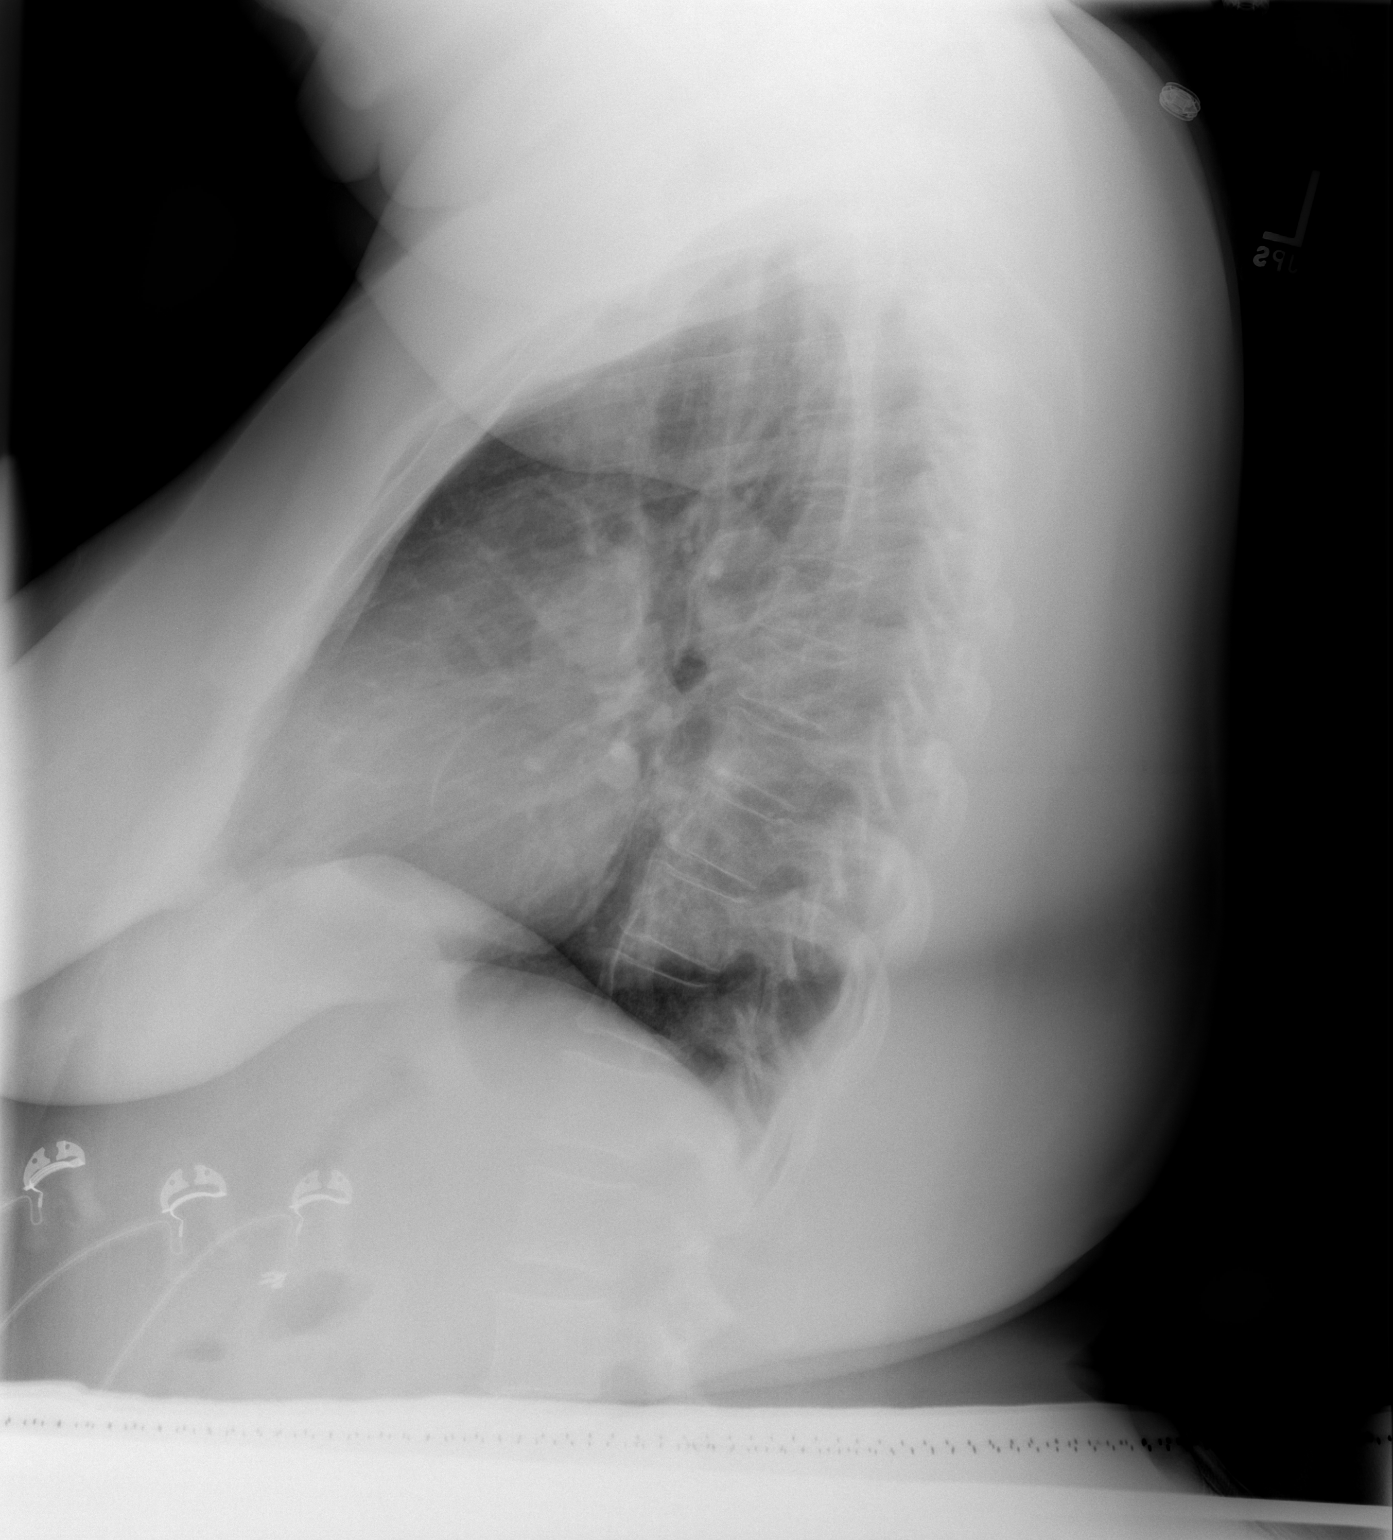

[2 of 2 positions shown; findings below may reference images not displayed]

FINDINGS: Heart is upper limits normal in size.  Lungs are clear.
No effusions or acute bony abnormality.
IMPRESSION: No acute cardiopulmonary disease.

## 2013-06-29 ENCOUNTER — Emergency Department (HOSPITAL_BASED_OUTPATIENT_CLINIC_OR_DEPARTMENT_OTHER)
Admission: EM | Admit: 2013-06-29 | Discharge: 2013-06-29 | Disposition: A | Discharge status: Home / Self Care | Payer: BC Managed Care – PPO | Attending: Emergency Medicine | Admitting: Emergency Medicine

## 2013-06-29 ENCOUNTER — Encounter (HOSPITAL_BASED_OUTPATIENT_CLINIC_OR_DEPARTMENT_OTHER): Payer: Self-pay | Admitting: Emergency Medicine

## 2013-06-29 DIAGNOSIS — E7800 Pure hypercholesterolemia, unspecified: Secondary | ICD-10-CM | POA: Insufficient documentation

## 2013-06-29 DIAGNOSIS — T448X5A Adverse effect of centrally-acting and adrenergic-neuron-blocking agents, initial encounter: Secondary | ICD-10-CM | POA: Insufficient documentation

## 2013-06-29 DIAGNOSIS — I10 Essential (primary) hypertension: Secondary | ICD-10-CM | POA: Insufficient documentation

## 2013-06-29 DIAGNOSIS — T783XXA Angioneurotic edema, initial encounter: Secondary | ICD-10-CM

## 2013-06-29 DIAGNOSIS — Z79899 Other long term (current) drug therapy: Secondary | ICD-10-CM | POA: Insufficient documentation

## 2013-06-29 LAB — BASIC METABOLIC PANEL
BUN: 15 mg/dL (ref 6–23)
CALCIUM: 9.8 mg/dL (ref 8.4–10.5)
CO2: 24 mEq/L (ref 19–32)
Chloride: 100 mEq/L (ref 96–112)
Creatinine, Ser: 0.9 mg/dL (ref 0.50–1.10)
GFR calc Af Amer: 89 mL/min — ABNORMAL LOW (ref >90)
GFR, EST NON AFRICAN AMERICAN: 77 mL/min — AB (ref >90)
GLUCOSE: 110 mg/dL — AB (ref 70–99)
Potassium: 4 mEq/L (ref 3.7–5.3)
SODIUM: 140 meq/L (ref 137–147)

## 2013-06-29 LAB — CBC WITH DIFFERENTIAL/PLATELET
BASOS PCT: 1 % (ref 0–1)
Basophils Absolute: 0 10*3/uL (ref 0.0–0.1)
EOS ABS: 0.2 10*3/uL (ref 0.0–0.7)
EOS PCT: 3 % (ref 0–5)
HCT: 38 % (ref 36.0–46.0)
Hemoglobin: 12.7 g/dL (ref 12.0–15.0)
LYMPHS ABS: 2.9 10*3/uL (ref 0.7–4.0)
Lymphocytes Relative: 35 % (ref 12–46)
MCH: 30.2 pg (ref 26.0–34.0)
MCHC: 33.4 g/dL (ref 30.0–36.0)
MCV: 90.3 fL (ref 78.0–100.0)
Monocytes Absolute: 0.6 10*3/uL (ref 0.1–1.0)
Monocytes Relative: 7 % (ref 3–12)
Neutro Abs: 4.5 10*3/uL (ref 1.7–7.7)
Neutrophils Relative %: 55 % (ref 43–77)
PLATELETS: 270 10*3/uL (ref 150–400)
RBC: 4.21 MIL/uL (ref 3.87–5.11)
RDW: 13.9 % (ref 11.5–15.5)
WBC: 8.2 10*3/uL (ref 4.0–10.5)

## 2013-06-29 LAB — TROPONIN I: Troponin I: <0.3 ng/mL (ref <0.30)

## 2013-06-29 MED ORDER — FAMOTIDINE 20 MG PO TABS: 20.0000 mg | ORAL_TABLET | Freq: Two times a day (BID) | ORAL | Status: DC

## 2013-06-29 MED ORDER — METHYLPREDNISOLONE SODIUM SUCC 125 MG IJ SOLR
125.0000 mg | Freq: Once | INTRAMUSCULAR | Status: AC
  Administered 2013-06-29: 125 mg via INTRAVENOUS
  Filled 2013-06-29: qty 2

## 2013-06-29 MED ORDER — SODIUM CHLORIDE 0.9 % IV BOLUS (SEPSIS)
1000.0000 mL | Freq: Once | INTRAVENOUS | Status: AC
  Administered 2013-06-29: 1000 mL via INTRAVENOUS

## 2013-06-29 MED ORDER — EPINEPHRINE 0.3 MG/0.3ML IJ SOAJ
0.3000 mg | Freq: Once | INTRAMUSCULAR | Status: DC
  Filled 2013-06-29: qty 0.6

## 2013-06-29 MED ORDER — FAMOTIDINE IN NACL 20-0.9 MG/50ML-% IV SOLN
20.0000 mg | Freq: Once | INTRAVENOUS | Status: AC
  Administered 2013-06-29: 20 mg via INTRAVENOUS
  Filled 2013-06-29: qty 50

## 2013-06-29 MED ORDER — EPINEPHRINE HCL 1 MG/ML IJ SOLN
INTRAMUSCULAR | Status: AC
  Filled 2013-06-29: qty 1

## 2013-06-29 MED ORDER — DIPHENHYDRAMINE HCL 50 MG/ML IJ SOLN
25.0000 mg | Freq: Once | INTRAMUSCULAR | Status: AC
  Administered 2013-06-29: 25 mg via INTRAVENOUS
  Filled 2013-06-29: qty 1

## 2013-06-29 MED ORDER — EPINEPHRINE HCL 1 MG/ML IJ SOLN
0.3000 mg | Freq: Once | INTRAMUSCULAR | Status: AC
  Administered 2013-06-29: 0.3 mg via INTRAMUSCULAR

## 2013-06-29 MED ORDER — PREDNISONE 50 MG PO TABS: ORAL_TABLET | ORAL | Status: DC

## 2013-06-29 MED ORDER — DIPHENHYDRAMINE HCL 25 MG PO TABS: 25.0000 mg | ORAL_TABLET | Freq: Four times a day (QID) | ORAL | Status: DC

## 2013-06-29 NOTE — ED Provider Notes (Signed)
CSN: [id number]     Arrival date & time 06/29/13  0715 History  This chart was scribed for Glynn Octave, MD by Leone Payor, ED Scribe. This patient was seen in room MH06/MH06 and the patient's care was started 7:27 AM.    Chief Complaint  Patient presents with  . Oral Swelling      The history is provided by the patient. No language interpreter was used.    HPI Comments: Brooke Trujillo is a 43 y.o. female who presents to the Emergency Department complaining of gradual onset, constant, gradually worsening upper lip swelling that began last night. She has associated facial tingling and numbness along with palpitations that also began last night. She denies swelling to the lower lip, tongue, or throat. She denies similar symptoms in the past. Patient states she takes lisinopril for HTN. She denies recent change in detergents, bath or body care products, or food. She denies chest pain, SOB, trouble swallowing, cough. She denies personal or family history of cardiac disease.   PCP Dr. Reola Calkins  Past Medical History  Diagnosis Date  . Hypertension   . High cholesterol    Past Surgical History  Procedure Laterality Date  . Tubal ligation    . Abdominal hysterectomy     No family history on file. History  Substance Use Topics  . Smoking status: Never Smoker   . Smokeless tobacco: Never Used  . Alcohol Use: No   OB History   Grav Para Term Preterm Abortions TAB SAB Ect Mult Living                 Review of Systems  A complete 10 system review of systems was obtained and all systems are negative except as noted in the HPI and PMH.    Allergies  Aspirin  Home Medications   Prior to Admission medications   Medication Sig Start Date End Date Taking? Authorizing Provider  hydrochlorothiazide (HYDRODIURIL) 25 MG tablet Take 1 tablet (25 mg total) by mouth daily. 04/02/11 04/01/12  Teressa Lower, NP  HYDROcodone-acetaminophen (NORCO/VICODIN) 5-325 MG per tablet Take 2 tablets by  mouth every 4 (four) hours as needed for pain. 07/10/12   Elson Areas, PA-C  LISINOPRIL PO Take by mouth.    Historical Provider, MD  omeprazole (PRILOSEC) 20 MG capsule Take 2 capsules (40 mg total) by mouth daily. 07/10/12   Elson Areas, PA-C   BP 102/59  Pulse 83  Temp(Src) 99.3 F (37.4 C) (Oral)  Resp 18  Ht 4\' 11"  (1.499 m)  Wt 250 lb (113.399 kg)  BMI 50.47 kg/m2  SpO2 100% Physical Exam  Nursing note and vitals reviewed. Constitutional: She is oriented to person, place, and time. She appears well-developed and well-nourished. No distress.  HENT:  Head: Normocephalic and atraumatic.  Mouth/Throat: Oropharynx is clear and moist. No oropharyngeal exudate.  No drooling. Diffuse swelling to the upper lip. No tongue swelling. Uvula midline. Floor of mouth soft.   Eyes: Conjunctivae and EOM are normal. Pupils are equal, round, and reactive to light. No scleral icterus.  Neck: Normal range of motion. Neck supple.  Cardiovascular: Normal rate, regular rhythm and normal heart sounds.   No murmur heard. Pulmonary/Chest: Effort normal and breath sounds normal. No respiratory distress. She has no wheezes. She has no rales.  Abdominal: Soft. She exhibits no distension.  Musculoskeletal: Normal range of motion. She exhibits no edema and no tenderness.  Neurological: She is alert and oriented to person, place, and time.  No cranial nerve deficit. She exhibits normal muscle tone. Coordination normal.  Skin: Skin is warm and dry. She is not diaphoretic.  Psychiatric: She has a normal mood and affect.    ED Course  Procedures (including critical care time)  DIAGNOSTIC STUDIES: Oxygen Saturation is 98% on RA, normal by my interpretation.    COORDINATION OF CARE: 7:35 AM Discussed treatment plan with pt at bedside and pt agreed to plan.  8:48 AM Upon recheck, patient states the swelling has slightly improved. She denies any pain.   11:45 AM Rechecked patient; she states the lip  swelling is improving. She denies throat swelling. Patient given return precautions and will be discharged home. Advised to stop taking lisinopril and to inform her PCP.    Medications  sodium chloride 0.9 % bolus 1,000 mL (0 mLs Intravenous Stopped 06/29/13 1224)  methylPREDNISolone sodium succinate (SOLU-MEDROL) 125 mg/2 mL injection 125 mg (125 mg Intravenous Given 06/29/13 0752)  famotidine (PEPCID) IVPB 20 mg (0 mg Intravenous Stopped 06/29/13 1224)  diphenhydrAMINE (BENADRYL) injection 25 mg (25 mg Intravenous Given 06/29/13 0752)  EPINEPHrine (ADRENALIN) injection 0.3 mg (0.3 mg Intramuscular Given 06/29/13 0801)      Labs Review Labs Reviewed  BASIC METABOLIC PANEL - Abnormal; Notable for the following:    Glucose, Bld 110 (*)    GFR calc non Af Amer 77 (*)    GFR calc Af Amer 89 (*)    All other components within normal limits  CBC WITH DIFFERENTIAL  TROPONIN I    Imaging Review No results found.   EKG Interpretation   Date/Time:  Tuesday Jun 29 2013 07:36:00 EDT Ventricular Rate:  79 PR Interval:  134 QRS Duration: 72 QT Interval:  392 QTC Calculation: 449 R Axis:   28 Text Interpretation:  Normal sinus rhythm Possible Left atrial enlargement  Borderline ECG No significant change was found Confirmed by Manus GunningANCOUR  MD,  Graylyn Bunney ([id number]) on 06/29/2013 7:43:53 AM      MDM   Final diagnoses:  Angioedema    Progressively worsening upper lip swelling since last night. No difficulty breathing or swallowing. No chest pain or shortness of breath. Suspect angioedema secondary to lisinopril.  IV fluids, steroids, antihistamines, epinephrine. No chest pain or SOB. Explained to patient that this is a reaction to lisinopril she cannot take that or any ACE inhibitor again.  Reassessment 11:45 AM. Patient observed in ED for extended period of time <3 hours.  No deterioration in condition. Upper lip swelling persists but has improved. No tongue, throat or lower lip swelling. Patient  is comfortable going home. She'll be discharged on prednisone and antihistamines. Informed she cannot take lisinopril again. Encouraged to return with worsening swelling, difficulty breathing or swallowing, or any other concerns.   Date: 06/29/2013  Rate: 79  Rhythm: normal sinus rhythm  QRS Axis: normal  Intervals: normal  ST/T Wave abnormalities: normal  Conduction Disutrbances:none  Narrative Interpretation:   Old EKG Reviewed: unchanged  CRITICAL CARE Performed by: Glynn OctaveStephen Rayhaan Huster Total critical care time: 30 Critical care time was exclusive of separately billable procedures and treating other patients. Critical care was necessary to treat or prevent imminent or life-threatening deterioration. Critical care was time spent personally by me on the following activities: development of treatment plan with patient and/or surrogate as well as nursing, discussions with consultants, evaluation of patient's response to treatment, examination of patient, obtaining history from patient or surrogate, ordering and performing treatments and interventions, ordering and review of laboratory studies, ordering  and review of radiographic studies, pulse oximetry and re-evaluation of patient's condition.      I personally performed the services described in this documentation, which was scribed in my presence. The recorded information has been reviewed and is accurate.   Glynn Octave, MD 06/29/13 [phone]

## 2013-06-29 NOTE — ED Notes (Signed)
Patient tolerating fluid challenge without difficulty.  Patient preparing for discharge.

## 2013-06-29 NOTE — ED Notes (Signed)
Lip swelling that started last night and was worse this am.

## 2013-06-29 NOTE — Discharge Instructions (Signed)
Angioedema Stop taking lisinopril.  Take the steroids as prescribed. Return to the ED if you develop worsening swelling, trouble breathing, chest pain or any other concerns. Angioedema is a sudden swelling of tissues, often of the skin. It can occur on the face or genitals or in the abdomen or other body parts. The swelling usually develops over a short period and gets better in 24 to 48 hours. It often begins during the night and is found when the person wakes up. The person may also get red, itchy patches of skin (hives). Angioedema can be dangerous if it involves swelling of the air passages.  Depending on the cause, episodes of angioedema may only happen once, come back in unpredictable patterns, or repeat for several years and then gradually fade away.  CAUSES  Angioedema can be caused by an allergic reaction to various triggers. It can also result from nonallergic causes, including reactions to drugs, immune system disorders, viral infections, or an abnormal gene that is passed to you from your parents (hereditary). For some people with angioedema, the cause is unknown.  Some things that can trigger angioedema include:   Foods.   Medicines, such as ACE inhibitors, ARBs, nonsteroidal anti-inflammatory agents, or estrogen.   Latex.   Animal saliva.   Insect stings.   Dyes used in X-rays.   Mild injury.   Dental work.  Surgery.  Stress.   Sudden changes in temperature.   Exercise. SIGNS AND SYMPTOMS   Swelling of the skin.  Hives. If these are present, there is also intense itching.  Redness in the affected area.   Pain in the affected area.  Swollen lips or tongue.  Breathing problems. This may happen if the air passages swell.  Wheezing. If internal organs are involved, there may be:   Nausea.   Abdominal pain.   Vomiting.   Difficulty swallowing.   Difficulty passing urine. DIAGNOSIS   Your health care provider will examine the affected  area and take a medical and family history.  Various tests may be done to help determine the cause. Tests may include:  Allergy skin tests to see if the problem is an allergic reaction.   Blood tests to check for hereditary angioedema.   Tests to check for underlying diseases that could cause the condition.   A review of your medicines, including over the counter medicines, may be done. TREATMENT  Treatment will depend on the cause of the angioedema. Possible treatments include:   Removal of anything that triggered the condition (such as stopping certain medicines).   Medicines to treat symptoms or prevent attacks. Medicines given may include:   Antihistamines.   Epinephrine injection.   Steroids.   Hospitalization may be required for severe attacks. If the air passages are affected, it can be an emergency. Tubes may need to be placed to keep the airway open. HOME CARE INSTRUCTIONS   Only take over-the-counter or prescription medicines as directed by your health care provider.  If you were given medicines for emergency allergy treatment, always carry them with you.  Wear a medical bracelet as directed by your health care provider.   Avoid known triggers. SEEK MEDICAL CARE IF:   You have repeat attacks of angioedema.   Your attacks are more frequent or more severe despite preventive measures.   You have hereditary angioedema and are considering having children. It is important to discuss the risks of passing the condition on to your children with your health care provider. SEEK IMMEDIATE  MEDICAL CARE IF:   You have severe swelling of the mouth, tongue, or lips.  You have difficulty breathing.   You have difficulty swallowing.   You faint. MAKE SURE YOU:  Understand these instructions.  Will watch your condition.  Will get help right away if you are not doing well or get worse. Document Released: 04/01/2001 Document Revised: 11/11/2012 Document  Reviewed: 09/14/2012 [facility] Patient Information 2014 [place], [place].

## 2014-08-01 DIAGNOSIS — M79604 Pain in right leg: Secondary | ICD-10-CM | POA: Insufficient documentation

## 2014-08-01 DIAGNOSIS — M79605 Pain in left leg: Secondary | ICD-10-CM | POA: Insufficient documentation

## 2015-01-02 DIAGNOSIS — J101 Influenza due to other identified influenza virus with other respiratory manifestations: Secondary | ICD-10-CM | POA: Insufficient documentation

## 2015-07-29 ENCOUNTER — Emergency Department (HOSPITAL_BASED_OUTPATIENT_CLINIC_OR_DEPARTMENT_OTHER)
Admission: EM | Admit: 2015-07-29 | Discharge: 2015-07-29 | Disposition: A | Discharge status: Home / Self Care | Payer: BC Managed Care – PPO | Attending: Emergency Medicine | Admitting: Emergency Medicine

## 2015-07-29 ENCOUNTER — Emergency Department (HOSPITAL_BASED_OUTPATIENT_CLINIC_OR_DEPARTMENT_OTHER): Payer: BC Managed Care – PPO

## 2015-07-29 ENCOUNTER — Encounter (HOSPITAL_BASED_OUTPATIENT_CLINIC_OR_DEPARTMENT_OTHER): Payer: Self-pay | Admitting: *Deleted

## 2015-07-29 ENCOUNTER — Other Ambulatory Visit: Payer: Self-pay

## 2015-07-29 DIAGNOSIS — R0602 Shortness of breath: Secondary | ICD-10-CM | POA: Insufficient documentation

## 2015-07-29 DIAGNOSIS — R110 Nausea: Secondary | ICD-10-CM | POA: Insufficient documentation

## 2015-07-29 DIAGNOSIS — I10 Essential (primary) hypertension: Secondary | ICD-10-CM | POA: Insufficient documentation

## 2015-07-29 DIAGNOSIS — R072 Precordial pain: Secondary | ICD-10-CM | POA: Diagnosis not present

## 2015-07-29 DIAGNOSIS — R079 Chest pain, unspecified: Secondary | ICD-10-CM

## 2015-07-29 DIAGNOSIS — R1013 Epigastric pain: Secondary | ICD-10-CM | POA: Diagnosis not present

## 2015-07-29 LAB — BASIC METABOLIC PANEL
Anion gap: 9 (ref 5–15)
BUN: 11 mg/dL (ref 6–20)
CALCIUM: 9.4 mg/dL (ref 8.9–10.3)
CO2: 25 mmol/L (ref 22–32)
CREATININE: 0.76 mg/dL (ref 0.44–1.00)
Chloride: 102 mmol/L (ref 101–111)
GFR calc Af Amer: >60 mL/min (ref >60)
GFR calc non Af Amer: >60 mL/min (ref >60)
GLUCOSE: 107 mg/dL — AB (ref 65–99)
Potassium: 3.9 mmol/L (ref 3.5–5.1)
Sodium: 136 mmol/L (ref 135–145)

## 2015-07-29 LAB — CBC
HCT: 40.5 % (ref 36.0–46.0)
HEMOGLOBIN: 13.3 g/dL (ref 12.0–15.0)
MCH: 29.9 pg (ref 26.0–34.0)
MCHC: 32.8 g/dL (ref 30.0–36.0)
MCV: 91 fL (ref 78.0–100.0)
PLATELETS: 284 10*3/uL (ref 150–400)
RBC: 4.45 MIL/uL (ref 3.87–5.11)
RDW: 13.7 % (ref 11.5–15.5)
WBC: 9.7 10*3/uL (ref 4.0–10.5)

## 2015-07-29 LAB — D-DIMER, QUANTITATIVE (NOT AT ARMC): D DIMER QUANT: 0.68 ug{FEU}/mL — AB (ref 0.00–0.50)

## 2015-07-29 LAB — TROPONIN I: Troponin I: <0.03 ng/mL (ref <0.031)

## 2015-07-29 MED ORDER — FAMOTIDINE IN NACL 20-0.9 MG/50ML-% IV SOLN
20.0000 mg | Freq: Once | INTRAVENOUS | Status: AC
  Administered 2015-07-29: 20 mg via INTRAVENOUS
  Filled 2015-07-29: qty 50

## 2015-07-29 MED ORDER — GI COCKTAIL ~~LOC~~
30.0000 mL | Freq: Once | ORAL | Status: AC
  Administered 2015-07-29: 30 mL via ORAL
  Filled 2015-07-29: qty 30

## 2015-07-29 MED ORDER — PERCOCET 5-325 MG PO TABS: 1.0000 | ORAL_TABLET | Freq: Four times a day (QID) | ORAL | Status: DC | PRN

## 2015-07-29 MED ORDER — IOPAMIDOL (ISOVUE-370) INJECTION 76%
100.0000 mL | Freq: Once | INTRAVENOUS | Status: AC | PRN
  Administered 2015-07-29: 100 mL via INTRAVENOUS

## 2015-07-29 NOTE — ED Provider Notes (Signed)
CSN: [id number]     Arrival date & time 07/29/15  1237 History   First MD Initiated Contact with Patient 07/29/15 1311     Chief Complaint  Patient presents with  . Chest Pain     (Consider location/radiation/quality/duration/timing/severity/associated sxs/prior Treatment) HPI   Patient is a 45 year old female past medical history of hypertension and hyperlipidemia who presents the ED with complaint of chest pain, onset yesterday around 6 PM. Patient reports she was at home walking around when she began to have gradual onset of constant midsternal/epigastric sharp/cramping chest pain. She reports feeling like her pain was likely due to "gas". Endorses history of acid reflux but states she has not been taking her prescription of ranitidine for the past few months. Pt reports having waxing and waning CP which she notes is worse with standing and deep breathing. Endorses associated nausea and SOB when her CP worsens. Denies fever, chills, headache, lightheadedness, dizziness, cough, wheezing, palpitations, vomiting, abdominal pain, leg swelling. Patient denies taking any medications prior to arrival. Endorses family history of cardiac disease. Denies smoking.  Past Medical History  Diagnosis Date  . Hypertension   . High cholesterol    Past Surgical History  Procedure Laterality Date  . Tubal ligation    . Abdominal hysterectomy    . Cholecystectomy     History reviewed. No pertinent family history. Social History  Substance Use Topics  . Smoking status: Never Smoker   . Smokeless tobacco: Never Used  . Alcohol Use: No   OB History    No data available     Review of Systems  Respiratory: Positive for shortness of breath.   Cardiovascular: Positive for chest pain.  Gastrointestinal: Positive for nausea.  All other systems reviewed and are negative.     Allergies  Aspirin; Contrast media; and Lisinopril  Home Medications   Prior to Admission medications   Not on File    BP 123/89 mmHg  Pulse 92  Temp(Src) 98.2 F (36.8 C) (Oral)  Resp 15  SpO2 100% Physical Exam  Constitutional: She is oriented to person, place, and time. She appears well-developed and well-nourished. No distress.  HENT:  Head: Normocephalic and atraumatic.  Mouth/Throat: Oropharynx is clear and moist. No oropharyngeal exudate.  Eyes: Conjunctivae and EOM are normal. Pupils are equal, round, and reactive to light. Right eye exhibits no discharge. Left eye exhibits no discharge. No scleral icterus.  Neck: Normal range of motion. Neck supple.  Cardiovascular: Normal rate, regular rhythm, normal heart sounds and intact distal pulses.   HR 86  Pulmonary/Chest: Effort normal and breath sounds normal. No respiratory distress. She has no wheezes. She has no rales. She exhibits tenderness (mild tenderness over lower midsternum).  Abdominal: Soft. Bowel sounds are normal. She exhibits no distension and no mass. There is tenderness (mild tenderness over epigastric region). There is no rebound and no guarding.  Musculoskeletal: Normal range of motion. She exhibits no edema.  Neurological: She is alert and oriented to person, place, and time.  Skin: Skin is warm and dry. She is not diaphoretic.  Nursing note and vitals reviewed.   ED Course  Procedures (including critical care time) Labs Review Labs Reviewed  BASIC METABOLIC PANEL - Abnormal; Notable for the following:    Glucose, Bld 107 (*)    All other components within normal limits  D-DIMER, QUANTITATIVE (NOT [facility]) - Abnormal; Notable for the following:    D-Dimer, Quant 0.68 (*)    All other components within normal  limits  CBC  TROPONIN I  TROPONIN I    Imaging Review Dg Chest 2 View  07/29/2015  CLINICAL DATA:  Chest pain, nausea and shortness of breath. EXAM: CHEST  2 VIEW COMPARISON:  05/08/2014 FINDINGS: The heart size and mediastinal contours are within normal limits. There is no evidence of pulmonary edema,  consolidation, pneumothorax, nodule or pleural fluid. The visualized skeletal structures are unremarkable. IMPRESSION: No active cardiopulmonary disease. Electronically Signed   By: Irish LackGlenn  Yamagata M.D.   On: 07/29/2015 13:30   Ct Angio Chest Pe W/cm &/or Wo Cm  07/29/2015  CLINICAL DATA:  Chest pain and shortness of breath. Elevated D-dimer. EXAM: CT ANGIOGRAPHY CHEST WITH CONTRAST TECHNIQUE: Multidetector CT imaging of the chest was performed using the standard protocol during bolus administration of intravenous contrast. Multiplanar CT image reconstructions and MIPs were obtained to evaluate the vascular anatomy. CONTRAST:  100 cc Isovue 370 IV. COMPARISON:  Chest radiograph from earlier today. 07/10/2012 chest CT angiogram. FINDINGS: Mediastinum/Nodes: The study is high quality for the evaluation of pulmonary embolism. There are no filling defects in the central, lobar, segmental or subsegmental pulmonary artery branches to suggest acute pulmonary embolism. Great vessels are normal in course and caliber. Normal heart size. No significant pericardial fluid/thickening. No discrete thyroid nodules. Unremarkable esophagus. No pathologically enlarged axillary, mediastinal or hilar lymph nodes. Lungs/Pleura: No pneumothorax. No pleural effusion. Stable subsegmental scarring in the left lower lobe. No acute consolidative airspace disease, significant pulmonary nodules or lung masses. Upper abdomen: Cholecystectomy. Musculoskeletal: No aggressive appearing focal osseous lesions. Mild thoracic spondylosis. Review of the MIP images confirms the above findings. IMPRESSION: 1. No pulmonary embolism. 2. No active pulmonary disease.  Stable left lower lobe scarring. Electronically Signed   By: Delbert PhenixJason A Poff M.D.   On: 07/29/2015 16:12   I have personally reviewed and evaluated these images and lab results as part of my medical decision-making.   EKG Interpretation None      MDM   Final diagnoses:  None    Patient presents with chest pain and associated shortness of breath and nausea that started yesterday. Endorses history of reflux but denies taking her prescription of ranitidine for the past few months. Hx of HTN and HLD. Family hx of heart disease. HEART score 3. VSS. Exam revealed mild tenderness over lower midsternum and epigastric region, lungs clear auscultation bilaterally, remaining abdominal exam benign, no peritoneal signs. Pt given IVF and pepcid in the ED. EKG showed sinus rhythm without any significant changes. CXR negative. Trop negative. CBC and BMP unremarkable. D-dimer elevated at 0.68. CT angio chest ordered to evaluate for PE. CT angios negative for PE. On reevaluation patient reports her chest pain has mildly improved. Discussed results and plan for delta troponin with patient. Patient given GI cocktail.  Hand-off to BoeingChris Lawyer, PA-C. Delta trop pending. Plan to d/c pt home with outpatient cardiology if delta trop negative and reflux medications.     Satira Sarkicole Elizabeth ClemmonsNadeau, [place] JerseyPA-C 07/29/15 1715  Loren Raceravid Yelverton, MD 08/04/15 [phone]

## 2015-07-29 NOTE — ED Notes (Signed)
Per pt report pain started yesterday felt like gas pain, increase pain this morning.

## 2015-07-29 NOTE — Discharge Instructions (Signed)
Return here as needed. Follow up with the cardiologist provided.  °

## 2015-07-29 NOTE — ED Notes (Signed)
Patient transported to X-ray 

## 2016-12-16 DIAGNOSIS — E876 Hypokalemia: Secondary | ICD-10-CM | POA: Insufficient documentation

## 2017-11-05 ENCOUNTER — Emergency Department (HOSPITAL_BASED_OUTPATIENT_CLINIC_OR_DEPARTMENT_OTHER): Payer: Self-pay

## 2017-11-05 ENCOUNTER — Emergency Department (HOSPITAL_BASED_OUTPATIENT_CLINIC_OR_DEPARTMENT_OTHER)
Admission: EM | Admit: 2017-11-05 | Discharge: 2017-11-05 | Discharge status: Against Medical Advice | Payer: Self-pay | Attending: Emergency Medicine | Admitting: Emergency Medicine

## 2017-11-05 ENCOUNTER — Other Ambulatory Visit: Payer: Self-pay

## 2017-11-05 ENCOUNTER — Encounter (HOSPITAL_BASED_OUTPATIENT_CLINIC_OR_DEPARTMENT_OTHER): Payer: Self-pay | Admitting: *Deleted

## 2017-11-05 DIAGNOSIS — R200 Anesthesia of skin: Secondary | ICD-10-CM | POA: Insufficient documentation

## 2017-11-05 DIAGNOSIS — R29898 Other symptoms and signs involving the musculoskeletal system: Secondary | ICD-10-CM

## 2017-11-05 DIAGNOSIS — M79601 Pain in right arm: Secondary | ICD-10-CM | POA: Insufficient documentation

## 2017-11-05 DIAGNOSIS — R519 Headache, unspecified: Secondary | ICD-10-CM

## 2017-11-05 DIAGNOSIS — R51 Headache: Secondary | ICD-10-CM | POA: Insufficient documentation

## 2017-11-05 DIAGNOSIS — R202 Paresthesia of skin: Secondary | ICD-10-CM | POA: Insufficient documentation

## 2017-11-05 DIAGNOSIS — M542 Cervicalgia: Secondary | ICD-10-CM | POA: Insufficient documentation

## 2017-11-05 DIAGNOSIS — I10 Essential (primary) hypertension: Secondary | ICD-10-CM | POA: Insufficient documentation

## 2017-11-05 LAB — CBC WITH DIFFERENTIAL/PLATELET
BASOS ABS: 0 10*3/uL (ref 0.0–0.1)
Basophils Relative: 0 %
Eosinophils Absolute: 0.2 10*3/uL (ref 0.0–0.7)
Eosinophils Relative: 2 %
HEMATOCRIT: 36 % (ref 36.0–46.0)
Hemoglobin: 11.8 g/dL — ABNORMAL LOW (ref 12.0–15.0)
LYMPHS ABS: 2.6 10*3/uL (ref 0.7–4.0)
LYMPHS PCT: 39 %
MCH: 29.1 pg (ref 26.0–34.0)
MCHC: 32.8 g/dL (ref 30.0–36.0)
MCV: 88.9 fL (ref 78.0–100.0)
MONO ABS: 0.5 10*3/uL (ref 0.1–1.0)
Monocytes Relative: 7 %
NEUTROS ABS: 3.5 10*3/uL (ref 1.7–7.7)
Neutrophils Relative %: 52 %
PLATELETS: 259 10*3/uL (ref 150–400)
RBC: 4.05 MIL/uL (ref 3.87–5.11)
RDW: 13.7 % (ref 11.5–15.5)
WBC: 6.7 10*3/uL (ref 4.0–10.5)

## 2017-11-05 LAB — COMPREHENSIVE METABOLIC PANEL
ALBUMIN: 3.7 g/dL (ref 3.5–5.0)
ALT: 12 U/L (ref 0–44)
AST: 17 U/L (ref 15–41)
Alkaline Phosphatase: 54 U/L (ref 38–126)
Anion gap: 9 (ref 5–15)
BUN: 6 mg/dL (ref 6–20)
CHLORIDE: 104 mmol/L (ref 98–111)
CO2: 26 mmol/L (ref 22–32)
CREATININE: 0.71 mg/dL (ref 0.44–1.00)
Calcium: 8.7 mg/dL — ABNORMAL LOW (ref 8.9–10.3)
GFR calc Af Amer: >60 mL/min (ref >60)
GLUCOSE: 87 mg/dL (ref 70–99)
Potassium: 3.3 mmol/L — ABNORMAL LOW (ref 3.5–5.1)
SODIUM: 139 mmol/L (ref 135–145)
Total Bilirubin: 0.4 mg/dL (ref 0.3–1.2)
Total Protein: 7.3 g/dL (ref 6.5–8.1)

## 2017-11-05 LAB — TROPONIN I: Troponin I: <0.03 ng/mL (ref <0.03)

## 2017-11-05 LAB — CBG MONITORING, ED: GLUCOSE-CAPILLARY: 78 mg/dL (ref 70–99)

## 2017-11-05 MED ORDER — DEXAMETHASONE SODIUM PHOSPHATE 10 MG/ML IJ SOLN
10.0000 mg | Freq: Once | INTRAMUSCULAR | Status: AC
  Administered 2017-11-05: 10 mg via INTRAVENOUS
  Filled 2017-11-05: qty 1

## 2017-11-05 MED ORDER — PROCHLORPERAZINE EDISYLATE 10 MG/2ML IJ SOLN
10.0000 mg | Freq: Once | INTRAMUSCULAR | Status: AC
  Administered 2017-11-05: 10 mg via INTRAVENOUS
  Filled 2017-11-05: qty 2

## 2017-11-05 MED ORDER — DIPHENHYDRAMINE HCL 50 MG/ML IJ SOLN
25.0000 mg | Freq: Once | INTRAMUSCULAR | Status: AC
  Administered 2017-11-05: 25 mg via INTRAVENOUS
  Filled 2017-11-05: qty 1

## 2017-11-05 NOTE — ED Provider Notes (Signed)
[facility] EMERGENCY DEPARTMENT Provider Note   CSN: [id number] Arrival date & time: 11/05/17  1015     History   Chief Complaint Chief Complaint  Patient presents with  . Headache  . Dizziness    HPI Brooke Trujillo is a 47 y.o. female.  HPI   Headache shooting down whole right side, started Sunday night, tried ibuprofen to prevent coming in Eased off how it felt Sunday night and Monday, however pain still shooting down right arm to right hand Right neck pain shooting down right arm, feels pinky side feels different than thumb side, but reports numbness around whole distal part of arm Weakness with right arm, feels like whole arm is weak.   No leg weakness or numbness, does have tingling in right leg Sunday was pain on right side, then late Sunday night had pain in shoulder, thought it was muscle, then it started shooting down arm, has eased up.  Numbness and weakness has been constant since Sunday night.  Felt like tightness at first, then felt numb.  No fevers, no n/v, No trouble talking, no trouble walking, right eye blurry sometimes, just started with headaches, comes and goes.  Felt dizzy while holding grandchild, room spinnnig and lightheaded, lasted a few minutes then improved  Took blood pressure medication Sunday, havent taken it since then because not feeling well.   Past Medical History:  Diagnosis Date  . High cholesterol   . Hypertension     There are no active problems to display for this patient.   Past Surgical History:  Procedure Laterality Date  . ABDOMINAL HYSTERECTOMY    . CHOLECYSTECTOMY    . TUBAL LIGATION       OB History   None      Home Medications    Prior to Admission medications   Medication Sig Start Date End Date Taking? Authorizing Provider  PERCOCET 5-325 MG tablet Take 1 tablet by mouth every 6 (six) hours as needed for severe pain. 07/29/15   Charlestine Night, PA-C    Family History History reviewed. No  pertinent family history.  Social History Social History   Tobacco Use  . Smoking status: Never Smoker  . Smokeless tobacco: Never Used  Substance Use Topics  . Alcohol use: No  . Drug use: No     Allergies   Aspirin; Contrast media [iodinated diagnostic agents]; and Lisinopril   Review of Systems Review of Systems  Constitutional: Negative for fever.  HENT: Negative for sore throat.   Eyes: Negative for visual disturbance.  Respiratory: Negative for cough and shortness of breath.   Cardiovascular: Negative for chest pain ( ).  Gastrointestinal: Negative for abdominal pain, nausea and vomiting.  Genitourinary: Negative for difficulty urinating and dysuria.  Musculoskeletal: Negative for back pain and neck pain.  Skin: Negative for rash.  Neurological: Positive for dizziness, weakness, numbness and headaches. Negative for syncope and speech difficulty.     Physical Exam Updated Vital Signs BP (!) 143/88 (BP Location: Right Arm)   Pulse 78   Temp 98.4 F (36.9 C) (Oral)   Resp (!) 9   Ht 4\' 11"  (1.499 m)   Wt 114.3 kg   SpO2 99%   BMI 50.90 kg/m   Physical Exam  Constitutional: She is oriented to person, place, and time. She appears well-developed and well-nourished. No distress.  HENT:  Head: Normocephalic and atraumatic.  Eyes: Conjunctivae and EOM are normal.  Neck: Normal range of motion.  Cardiovascular: Normal rate,  regular rhythm, normal heart sounds and intact distal pulses. Exam reveals no gallop and no friction rub.  No murmur heard. Pulmonary/Chest: Effort normal and breath sounds normal. No respiratory distress. She has no wheezes. She has no rales.  Abdominal: Soft. She exhibits no distension. There is no tenderness. There is no guarding.  Musculoskeletal: She exhibits no edema or tenderness.  Neurological: She is alert and oriented to person, place, and time.  Skin: Skin is warm and dry. No rash noted. She is not diaphoretic. No erythema.    Nursing note and vitals reviewed.    ED Treatments / Results  Labs (all labs ordered are listed, but only abnormal results are displayed) Labs Reviewed  CBC WITH DIFFERENTIAL/PLATELET - Abnormal; Notable for the following components:      Result Value   Hemoglobin 11.8 (*)    All other components within normal limits  COMPREHENSIVE METABOLIC PANEL - Abnormal; Notable for the following components:   Potassium 3.3 (*)    Calcium 8.7 (*)    All other components within normal limits  TROPONIN I  CBG MONITORING, ED    EKG EKG Interpretation  Date/Time:  Wednesday November 05 2017 10:43:35 EDT Ventricular Rate:  85 PR Interval:    QRS Duration: 80 QT Interval:  383 QTC Calculation: 456 R Axis:   30 Text Interpretation:  Sinus rhythm Probable left atrial enlargement Low voltage, precordial leads No significant change since last tracing Confirmed by Alvira Monday ([id number]) on 11/05/2017 11:56:40 AM   Radiology Ct Head Wo Contrast  Result Date: 11/05/2017 CLINICAL DATA:  Right side headache, dizziness and blurry vision for 3 days. EXAM: CT HEAD WITHOUT CONTRAST TECHNIQUE: Contiguous axial images were obtained from the base of the skull through the vertex without intravenous contrast. COMPARISON:  Head CT scan 01/21/2016. FINDINGS: Brain: No evidence of acute infarction, hemorrhage, hydrocephalus, extra-axial collection or mass lesion/mass effect. Vascular: No hyperdense vessel or unexpected calcification. Skull: Intact.  No focal lesion. Sinuses/Orbits: Negative. Other: None. IMPRESSION: Negative head CT. Electronically Signed   By: Drusilla Kanner M.D.   On: 11/05/2017 12:41   Ct Cervical Spine Wo Contrast  Result Date: 11/05/2017 CLINICAL DATA:  Right-sided neck pain radiating to the shoulder for 3 days EXAM: CT CERVICAL SPINE WITHOUT CONTRAST TECHNIQUE: Multidetector CT imaging of the cervical spine was performed without intravenous contrast. Multiplanar CT image reconstructions  were also generated. COMPARISON:  None. FINDINGS: Alignment: Normal Skull base and vertebrae: No acute fracture. No primary bone lesion or focal pathologic process. Soft tissues and spinal canal: No prevertebral fluid or swelling. No visible canal hematoma. Disc levels: Disc narrowing and endplate spurring mainly at C4-5 to C6-7, with mild posterior longitudinal ligament ossification at C5 and C6. No bony canal or foraminal stenosis. At most disc levels a herniation would not be apparent due to artifact. Upper chest: Negative IMPRESSION: 1. No acute finding. 2. Disc degeneration without bony stenosis. Electronically Signed   By: Marnee Spring M.D.   On: 11/05/2017 13:07    Procedures Procedures (including critical care time)  Medications Ordered in ED Medications  prochlorperazine (COMPAZINE) injection 10 mg (has no administration in time range)  diphenhydrAMINE (BENADRYL) injection 25 mg (has no administration in time range)  dexamethasone (DECADRON) injection 10 mg (has no administration in time range)     Initial Impression / Assessment and Plan / ED Course  I have reviewed the triage vital signs and the nursing notes.  Pertinent labs & imaging results that were available  during my care of the patient were reviewed by me and considered in my medical decision making (see chart for details).     47 year old female with a history of hypertension and hyperlipidemia presents with concern for headache, neck pain, pain radiating down her right shoulder, with right arm weakness, numbness, and tingling of the right leg since Sunday.  CT head and cervical spine without acute changes.  Overall, suspect possible cervical radiculopathy-- however given risk factors and lower extremity symptoms, will obtain MR brain and cervical spine for further evaluation. She has some weakness in RUE and if cervical impingement would discuss with NSU, if CVA will admit to medicine.    Given headache cocktail,  decadron in ED>    Final Clinical Impressions(s) / ED Diagnoses   Final diagnoses:  Right arm weakness  Right arm numbness  Acute nonintractable headache, unspecified headache type  Neck pain    ED Discharge Orders    None       Alvira Monday, MD 11/05/17 1402

## 2017-11-05 NOTE — Discharge Instructions (Signed)
You are leaving AGAINST MEDICAL ADVICE.  You were encouraged to stay for your MRI of brain and cervical spine to help rule out acute stroke, nerve impingement or spinal cord abnormality.  By leaving, you understand that if you have something emergent in your brain or spinal cord, the diagnosis likely will be delayed and you could suffer permanent neurologic deficit and/or death.  If at any point you decide to return or especially if your symptoms worsen you are encouraged to return as soon as possible.  Otherwise follow-up very closely with your primary care physician.

## 2017-11-05 NOTE — ED Triage Notes (Addendum)
Pt reports headache with right sided face and neck and shoulder pain, radiating down her right arm, some relief with heating pad, pt reports she has been taking her bp meds "every few days", last dose on Sunday, reports increased sob and feeling dizzy at times. Feet noted with pitting edema at triage.

## 2017-11-05 NOTE — ED Provider Notes (Signed)
Patient transferred here for MRI.  Unfortunately, the MRI has been backed up and has been unable to come get her for her testing.  While waiting, the patient now wants to leave.  I discussed possible risks of leaving without a definitive diagnosis such as undiagnosed stroke or spinal emergency.  She understands this and understands possible neurologic complications/sequelae as well as possible death.  Still wants to leave.  I discussed that she may return at any time is encouraged to do so, otherwise follow-up closely with PCP.   Pricilla Loveless, MD 11/05/17 [phone]

## 2017-11-05 NOTE — ED Notes (Signed)
EDP explained delay and plan of care , pt. expressed desire to leave due to long wait.

## 2018-03-29 ENCOUNTER — Emergency Department (HOSPITAL_BASED_OUTPATIENT_CLINIC_OR_DEPARTMENT_OTHER)
Admission: EM | Admit: 2018-03-29 | Discharge: 2018-03-29 | Disposition: A | Discharge status: Home / Self Care | Payer: Self-pay | Attending: Emergency Medicine | Admitting: Emergency Medicine

## 2018-03-29 ENCOUNTER — Encounter (HOSPITAL_BASED_OUTPATIENT_CLINIC_OR_DEPARTMENT_OTHER): Payer: Self-pay | Admitting: Emergency Medicine

## 2018-03-29 ENCOUNTER — Other Ambulatory Visit: Payer: Self-pay

## 2018-03-29 ENCOUNTER — Emergency Department (HOSPITAL_BASED_OUTPATIENT_CLINIC_OR_DEPARTMENT_OTHER): Payer: Self-pay

## 2018-03-29 DIAGNOSIS — J189 Pneumonia, unspecified organism: Secondary | ICD-10-CM | POA: Insufficient documentation

## 2018-03-29 DIAGNOSIS — I10 Essential (primary) hypertension: Secondary | ICD-10-CM | POA: Insufficient documentation

## 2018-03-29 DIAGNOSIS — R911 Solitary pulmonary nodule: Secondary | ICD-10-CM | POA: Insufficient documentation

## 2018-03-29 MED ORDER — ALBUTEROL SULFATE HFA 108 (90 BASE) MCG/ACT IN AERS
2.0000 | INHALATION_SPRAY | Freq: Once | RESPIRATORY_TRACT | Status: AC
  Administered 2018-03-29: 2 via RESPIRATORY_TRACT
  Filled 2018-03-29: qty 6.7

## 2018-03-29 MED ORDER — AZITHROMYCIN 250 MG PO TABS: 250.0000 mg | ORAL_TABLET | Freq: Every day | ORAL | 0 refills | Status: DC

## 2018-03-29 NOTE — Discharge Instructions (Signed)
The lung nodule on CT scan is probably from an infection.  However you will need a repeat CT scan in 3 months to ensure that it goes away.  Follow-up with your primary care physician to get this outpatient CT scan done.  You may take the albuterol inhaler 1-2 puffs every 4 hours as needed for cough or shortness of breath.  If you develop fever, vomiting, trouble breathing, or new/concerning symptoms then return to the ER for evaluation.

## 2018-03-29 NOTE — ED Provider Notes (Signed)
[facility] EMERGENCY DEPARTMENT Provider Note   CSN: [id number] Arrival date & time: 03/29/18  1809    History   Chief Complaint Chief Complaint  Patient presents with  . Cough    HPI Brooke Trujillo is a 48 y.o. female.     HPI  48 year old female presents with cough for 2 weeks.  She states she is been having body aches and feeling under the weather for about 3 weeks.  Now cough with some productive sputum for 2 weeks.  She has also had some sore throat and chest wall pain after coughing.  No fevers during this time.  No significant shortness of breath.  She does not smoke and never has.  Past Medical History:  Diagnosis Date  . High cholesterol   . Hypertension     There are no active problems to display for this patient.   Past Surgical History:  Procedure Laterality Date  . ABDOMINAL HYSTERECTOMY    . CHOLECYSTECTOMY    . TUBAL LIGATION       OB History   No obstetric history on file.      Home Medications    Prior to Admission medications   Medication Sig Start Date End Date Taking? Authorizing Provider  azithromycin (ZITHROMAX) 250 MG tablet Take 1 tablet (250 mg total) by mouth daily. Take first 2 tablets together, then 1 every day until finished. 03/29/18   Pricilla Loveless, MD  hydrochlorothiazide (HYDRODIURIL) 25 MG tablet TK 1 T PO QD 02/26/18   [provider]  hydrochlorothiazide (HYDRODIURIL) 50 MG tablet Take 50 mg by mouth daily. 09/27/16   [provider]  ibuprofen (ADVIL,MOTRIN) 200 MG tablet Take 200 mg by mouth every 6 (six) hours as needed for headache or mild pain.    [provider]  PERCOCET 5-325 MG tablet Take 1 tablet by mouth every 6 (six) hours as needed for severe pain. Patient not taking: Reported on 11/05/2017 07/29/15   Charlestine Night, PA-C    Family History No family history on file.  Social History Social History   Tobacco Use  . Smoking status: Never Smoker  . Smokeless  tobacco: Never Used  Substance Use Topics  . Alcohol use: No  . Drug use: No     Allergies   Aspirin; Contrast media [iodinated diagnostic agents]; and Lisinopril   Review of Systems Review of Systems  Constitutional: Negative for fever.  HENT: Positive for sore throat.   Respiratory: Positive for cough.   Cardiovascular: Positive for chest pain (when coughing).  Gastrointestinal: Negative for vomiting.  All other systems reviewed and are negative.    Physical Exam Updated Vital Signs BP 116/87 (BP Location: Left Arm)   Pulse 82   Temp 98.2 F (36.8 C) (Oral)   Resp (!) 22   Ht  (1.499 m)   Wt 102.1 kg   SpO2 100%   BMI 45.44 kg/m   Physical Exam Vitals signs and nursing note reviewed.  Constitutional:      Appearance: She is well-developed. She is obese.  HENT:     Head: Normocephalic and atraumatic.     Right Ear: External ear normal.     Left Ear: External ear normal.     Nose: Nose normal.     Mouth/Throat:     Pharynx: No oropharyngeal exudate or posterior oropharyngeal erythema.  Eyes:     General:        Right eye: No discharge.  Left eye: No discharge.  Cardiovascular:     Rate and Rhythm: Normal rate and regular rhythm.     Heart sounds: Normal heart sounds.  Pulmonary:     Effort: Pulmonary effort is normal.     Breath sounds: Normal breath sounds. No wheezing, rhonchi or rales.  Chest:     Chest wall: No tenderness.  Abdominal:     General: There is no distension.  Skin:    General: Skin is warm and dry.  Neurological:     Mental Status: She is alert.  Psychiatric:        Mood and Affect: Mood is not anxious.      ED Treatments / Results  Labs (all labs ordered are listed, but only abnormal results are displayed) Labs Reviewed - No data to display  EKG None  Radiology Dg Chest 2 View  Result Date: 03/29/2018 CLINICAL DATA:  Chest pain with sore throat and fever. EXAM: CHEST - 2 VIEW COMPARISON:  07/29/2015  FINDINGS: Low volume film. The cardio pericardial silhouette is enlarged. No edema or pleural effusion. Small pulmonary nodule identified in the left lower lung superimposed on the anterior left fourth rib. The visualized bony structures of the thorax are intact. IMPRESSION: 1. Small nodular density seen at the left lung base. CT chest without contrast recommended to further evaluate. 2. No acute cardiopulmonary findings. Electronically Signed   By: Kennith Center M.D.   On: 03/29/2018 19:13   Ct Chest Wo Contrast  Result Date: 03/29/2018 CLINICAL DATA:  Three-week history of cough. Nodule seen on chest x-ray. EXAM: CT CHEST WITHOUT CONTRAST TECHNIQUE: Multidetector CT imaging of the chest was performed following the standard protocol without IV contrast. COMPARISON:  07/29/2015 FINDINGS: Cardiovascular: The heart size is normal. No substantial pericardial effusion. Mediastinum/Nodes: No mediastinal lymphadenopathy. No evidence for gross hilar lymphadenopathy although assessment is limited by the lack of intravenous contrast on today's study. The esophagus has normal imaging features. There is no axillary lymphadenopathy. Lungs/Pleura: The central tracheobronchial airways are patent. The 9 mm nodular opacity left lower lobe, just posterior to the major fissure is identified on 81/3. There is some subtle surrounding patchy ground-glass attenuation. Left lower lobe subsegmental atelectasis noted. No pleural effusion. Upper Abdomen: Unremarkable. Musculoskeletal: No worrisome lytic or sclerotic osseous abnormality. IMPRESSION: 1. 9 mm sub solid nodular opacity in the left lower lobe has surrounding patchy ground-glass attenuation. This is probably a small focus of infectious/inflammatory alveolitis and atypical infection is a possibility. Given the nodular configuration, follow-up CT chest after therapy in 3 months recommended to ensure resolution. Electronically Signed   By: Kennith Center M.D.   On: 03/29/2018  20:31    Procedures Procedures (including critical care time)  Medications Ordered in ED Medications  albuterol (PROVENTIL HFA;VENTOLIN HFA) 108 (90 Base) MCG/ACT inhaler 2 puff (2 puffs Inhalation Given 03/29/18 2029)     Initial Impression / Assessment and Plan / ED Course  I have reviewed the triage vital signs and the nursing notes.  Pertinent labs & imaging results that were available during my care of the patient were reviewed by me and considered in my medical decision making (see chart for details).        CT obtained given the lung nodule and recommendations from radiology.  This appears to be infectious/inflammatory which would go along with her prolonged URI type course.  She has been given albuterol with some partial relief.  She will be discharged with azithromycin.  I discussed the  importance of following up for repeat CT in 3 months to ensure this goes away.  Otherwise appears well.  Discharged home with return precautions.  Final Clinical Impressions(s) / ED Diagnoses   Final diagnoses:  Atypical pneumonia  Lung nodule    ED Discharge Orders         Ordered    azithromycin (ZITHROMAX) 250 MG tablet  Daily     03/29/18 2126           Pricilla Loveless, MD 03/30/18 0004

## 2018-03-29 NOTE — ED Triage Notes (Signed)
Cough x 3 weeks.

## 2019-04-26 ENCOUNTER — Other Ambulatory Visit: Payer: Self-pay

## 2019-04-26 ENCOUNTER — Encounter (HOSPITAL_BASED_OUTPATIENT_CLINIC_OR_DEPARTMENT_OTHER): Payer: Self-pay

## 2019-04-26 ENCOUNTER — Emergency Department (HOSPITAL_BASED_OUTPATIENT_CLINIC_OR_DEPARTMENT_OTHER)
Admission: EM | Admit: 2019-04-26 | Discharge: 2019-04-26 | Disposition: A | Discharge status: Home / Self Care | Payer: BC Managed Care – PPO | Attending: Emergency Medicine | Admitting: Emergency Medicine

## 2019-04-26 DIAGNOSIS — M545 Low back pain, unspecified: Secondary | ICD-10-CM

## 2019-04-26 DIAGNOSIS — E782 Mixed hyperlipidemia: Secondary | ICD-10-CM | POA: Diagnosis not present

## 2019-04-26 DIAGNOSIS — Z886 Allergy status to analgesic agent status: Secondary | ICD-10-CM | POA: Insufficient documentation

## 2019-04-26 DIAGNOSIS — Z91041 Radiographic dye allergy status: Secondary | ICD-10-CM | POA: Diagnosis not present

## 2019-04-26 DIAGNOSIS — I10 Essential (primary) hypertension: Secondary | ICD-10-CM | POA: Insufficient documentation

## 2019-04-26 DIAGNOSIS — Z888 Allergy status to other drugs, medicaments and biological substances status: Secondary | ICD-10-CM | POA: Diagnosis not present

## 2019-04-26 DIAGNOSIS — Z79899 Other long term (current) drug therapy: Secondary | ICD-10-CM | POA: Insufficient documentation

## 2019-04-26 DIAGNOSIS — G8929 Other chronic pain: Secondary | ICD-10-CM | POA: Insufficient documentation

## 2019-04-26 MED ORDER — METHOCARBAMOL 500 MG PO TABS: 500.0000 mg | ORAL_TABLET | Freq: Two times a day (BID) | ORAL | 0 refills | Status: AC

## 2019-04-26 MED ORDER — KETOROLAC TROMETHAMINE 60 MG/2ML IM SOLN
30.0000 mg | Freq: Once | INTRAMUSCULAR | Status: AC
  Administered 2019-04-26: 17:00:00 30 mg via INTRAMUSCULAR
  Filled 2019-04-26: qty 2

## 2019-04-26 MED FILL — METHOCARBAMOL 500 MG TABS: 500 | 10 days supply | Qty: 20 | Fill #0

## 2019-04-26 NOTE — ED Provider Notes (Signed)
[facility] EMERGENCY DEPARTMENT Provider Note   CSN: [id number] Arrival date & time: 04/26/19  1613     History Chief Complaint  Patient presents with  . Back Pain    Brooke Trujillo is a 49 y.o. female.  HPI  Is a 49 year old female with a history of obesity and low back pain presenting today for low back pain.  She states that been ongoing for 1 month.  She states is achy, constant, worse with movement and touch.  She states that it feels tight and she has been doing some stretching which helps however does not make the pain go away completely.  She states that she has used ibuprofen some and Tylenol some although she has not used them together.  History without symptoms of urinary or stool retention or incontinence, neurologic changes such as sensation change or weakness lower extremities, coagulopathy or blood thinner use, is not elderly or with history of osteoporosis, denies any history of cancer, fever, IV drug use, weight changes (unexplained), or prolonged steroid use.      Past Medical History:  Diagnosis Date  . High cholesterol   . Hypertension     There are no problems to display for this patient.   Past Surgical History:  Procedure Laterality Date  . ABDOMINAL HYSTERECTOMY    . CHOLECYSTECTOMY    . TUBAL LIGATION       OB History   No obstetric history on file.     No family history on file.  Social History   Tobacco Use  . Smoking status: Never Smoker  . Smokeless tobacco: Never Used  Substance Use Topics  . Alcohol use: No  . Drug use: No    Home Medications Prior to Admission medications   Medication Sig Start Date End Date Taking? Authorizing Provider  atorvastatin (LIPITOR) 10 MG tablet Take 10 mg by mouth daily.   Yes [provider]  olmesartan (BENICAR) 40 MG tablet Take 40 mg by mouth daily.   Yes [provider]  hydrochlorothiazide (HYDRODIURIL) 25 MG tablet TK 1 T PO QD 02/26/18   [provider]  hydrochlorothiazide (HYDRODIURIL) 50 MG tablet Take 50 mg by mouth daily. 09/27/16   [provider]  ibuprofen (ADVIL,MOTRIN) 200 MG tablet Take 200 mg by mouth every 6 (six) hours as needed for headache or mild pain.    [provider]  methocarbamol (ROBAXIN) 500 MG tablet Take 1 tablet (500 mg total) by mouth 2 (two) times daily. 04/26/19   Gailen Shelter, PA    Allergies    Aspirin, Contrast media [iodinated diagnostic agents], and Lisinopril  Review of Systems   Review of Systems  Constitutional: Negative for fever.  HENT: Negative for congestion.   Respiratory: Negative for shortness of breath.   Cardiovascular: Negative for chest pain.  Gastrointestinal: Negative for abdominal distention.  Musculoskeletal: Positive for back pain.  Neurological: Negative for dizziness and headaches.    Physical Exam Updated Vital Signs BP (!) 163/100 (BP Location: Right Arm)   Pulse 80   Temp 98.7 F (37.1 C) (Oral)   Resp 20   Ht 4\' 11"  (1.499 m)   Wt 114.3 kg   SpO2 100%   BMI 50.90 kg/m   Physical Exam Vitals and nursing note reviewed.  Constitutional:      General: She is not in acute distress. HENT:     Head: Normocephalic and atraumatic.     Nose: Nose normal.  Eyes:  General: No scleral icterus. Cardiovascular:     Rate and Rhythm: Normal rate and regular rhythm.     Pulses: Normal pulses.     Heart sounds: Normal heart sounds.  Pulmonary:     Effort: Pulmonary effort is normal. No respiratory distress.     Breath sounds: No wheezing.  Abdominal:     Palpations: Abdomen is soft.     Tenderness: There is no abdominal tenderness.  Musculoskeletal:     Cervical back: Normal range of motion.     Right lower leg: No edema.     Left lower leg: No edema.     Comments: No midline back tenderness palpation.  There is left low back tenderness palpation with multiple trigger points.  Skin:    General: Skin is warm and dry.     Capillary Refill:  Capillary refill takes less than 2 seconds.  Neurological:     Mental Status: She is alert. Mental status is at baseline.     Comments: Alert and oriented to self, place, time and event.   Speech is fluent, clear without dysarthria or dysphasia.   Strength 5/5 in upper/lower extremities  Sensation intact in upper/lower extremities   Normal gait.  Negative Romberg. No pronator drift.  Normal finger-to-nose and feet tapping.  CN I not tested  CN II grossly intact visual fields bilaterally. Did not visualize posterior eye.   CN III, IV, VI PERRLA and EOMs intact bilaterally  CN V Intact sensation to sharp and light touch to the face  CN VII facial movements symmetric  CN VIII not tested  CN IX, X no uvula deviation, symmetric rise of soft palate  CN XI 5/5 SCM and trapezius strength bilaterally  CN XII Midline tongue protrusion, symmetric L/R movements    Psychiatric:        Mood and Affect: Mood normal.        Behavior: Behavior normal.     ED Results / Procedures / Treatments   Labs (all labs ordered are listed, but only abnormal results are displayed) Labs Reviewed - No data to display  EKG None  Radiology No results found.  Procedures Procedures (including critical care time)  Medications Ordered in ED Medications  ketorolac (TORADOL) injection 30 mg (30 mg Intramuscular Given 04/26/19 1727)    ED Course  I have reviewed the triage vital signs and the nursing notes.  Pertinent labs & imaging results that were available during my care of the patient were reviewed by me and considered in my medical decision making (see chart for details).    MDM Rules/Calculators/A&P                      Patient 49 year old female with reproducible low back tenderness to palpation consistent with musculoskeletal back pain.  She is has been seen for approximately 1 month perhaps longer.  She has some radicular symptoms however negative straight leg raise.  Vital signs are  within normal is apart from high blood pressure which patient states is normal for her.  Broad differential for back pain considered includes malignancy, disc herniation, spinal epidural abscess, spinal fracture, cauda equina, pyelonephritis, kidney stone, AAA, AD, pancreatitis, PE and PTX.   History without symptoms of urinary or stool retention or incontinence, neurologic changes such as sensation change or weakness lower extremities, coagulopathy or blood thinner use, is not elderly or with history of osteoporosis, denies any history of cancer, fever, IV drug use, weight changes (unexplained), or  prolonged steroid use.   Physical exam most consistent with muscular strain. Doubt cauda equina or disc herniation d/t lack of saddle anesthesia/bowel or bladder incontinence or urinary retention, normal gait and reassuring physical examination without neurologic deficits.   History is not supportive of kidney stone, AAA, AD, pancreatitis, PE or PTX. Patient has no CVA tenderness or urinary sx to suggest pyelonephritis or kidney stone.   Will manage patient conservatively at this time. NSAIDs, back exercises/stretches, heat therapy and follow up with PCP if symptoms do not resolve in 3-4 weeks. Patient offered muscle relaxer for comfort at night. Counseled on need to return to ED for fever, worsening or concerning symptoms. Patient agreeable to plan and states understanding of follow up plans and return precautions.    Vitals WNL at time of discharge and patient is no acute distress  Offered Toradol shot -- accepted   Patient will follow up with PCP for elevated blood pressure.  Final Clinical Impression(s) / ED Diagnoses Final diagnoses:  Chronic low back pain without sciatica, unspecified back pain laterality    Rx / DC Orders ED Discharge Orders         Ordered    methocarbamol (ROBAXIN) 500 MG tablet  2 times daily     04/26/19 1727           Pati Gallo [place], [place] 04/26/19 2326      Fredia Sorrow, MD 04/28/19 1654

## 2019-04-26 NOTE — ED Triage Notes (Signed)
Pt c/o back pain that has gradually become worse over 1 month. Pt states today the pain radiates down L leg; denies incontinence.

## 2019-04-26 NOTE — Discharge Instructions (Signed)
Your examination today is most concerning for a muscular injury 1. Medications: alternate ibuprofen and tylenol for pain control, take all usual home medications as they are prescribed.  I also recommend taking the Robaxin that I prescribed you for pain. 2. Treatment: rest, ice, elevate and use an ACE wrap or other compressive therapy to decrease swelling. Also drink plenty of fluids and do plenty of gentle stretching and move the affected muscle through its normal range of motion to prevent stiffness. 3. Follow Up: If your symptoms do not improve please follow up with orthopedics/sports medicine or your PCP for discussion of your diagnoses and further evaluation after today's visit; if you do not have a primary care doctor use the resource guide provided to find one; Please return to the ER for worsening symptoms or other concerns.   Please do the back exercises that I have printed for you.  Please do gentle stretching, with warm salt water soaks with Epson salts or similar.  Light exercise to lose weight.  Also recommend acupuncture or massage.

## 2019-04-26 NOTE — ED Notes (Signed)
Pt states has been out of her blood pressure medication and missed last 2 doses, going to pick up this evening on her way home.

## 2019-11-08 DIAGNOSIS — G8929 Other chronic pain: Secondary | ICD-10-CM | POA: Insufficient documentation

## 2019-11-08 DIAGNOSIS — M47816 Spondylosis without myelopathy or radiculopathy, lumbar region: Secondary | ICD-10-CM | POA: Insufficient documentation

## 2019-11-08 DIAGNOSIS — M5136 Other intervertebral disc degeneration, lumbar region: Secondary | ICD-10-CM | POA: Insufficient documentation

## 2019-11-08 DIAGNOSIS — M51369 Other intervertebral disc degeneration, lumbar region without mention of lumbar back pain or lower extremity pain: Secondary | ICD-10-CM | POA: Insufficient documentation

## 2019-11-08 DIAGNOSIS — M545 Low back pain, unspecified: Secondary | ICD-10-CM | POA: Insufficient documentation

## 2020-03-14 DIAGNOSIS — L83 Acanthosis nigricans: Secondary | ICD-10-CM | POA: Insufficient documentation

## 2020-06-07 ENCOUNTER — Emergency Department (HOSPITAL_COMMUNITY): Payer: BC Managed Care – PPO

## 2020-06-07 ENCOUNTER — Other Ambulatory Visit: Payer: Self-pay

## 2020-06-07 ENCOUNTER — Emergency Department (HOSPITAL_COMMUNITY)
Admission: EM | Admit: 2020-06-07 | Discharge: 2020-06-07 | Disposition: A | Discharge status: Home / Self Care | Payer: BC Managed Care – PPO | Attending: Emergency Medicine | Admitting: Emergency Medicine

## 2020-06-07 DIAGNOSIS — R110 Nausea: Secondary | ICD-10-CM | POA: Diagnosis not present

## 2020-06-07 DIAGNOSIS — I10 Essential (primary) hypertension: Secondary | ICD-10-CM | POA: Diagnosis not present

## 2020-06-07 DIAGNOSIS — R0789 Other chest pain: Secondary | ICD-10-CM | POA: Insufficient documentation

## 2020-06-07 DIAGNOSIS — M79602 Pain in left arm: Secondary | ICD-10-CM | POA: Insufficient documentation

## 2020-06-07 DIAGNOSIS — Z79899 Other long term (current) drug therapy: Secondary | ICD-10-CM | POA: Diagnosis not present

## 2020-06-07 LAB — URINALYSIS, ROUTINE W REFLEX MICROSCOPIC
Bilirubin Urine: NEGATIVE
Glucose, UA: NEGATIVE mg/dL
Ketones, ur: NEGATIVE mg/dL
Leukocytes,Ua: NEGATIVE
Nitrite: NEGATIVE
Protein, ur: NEGATIVE mg/dL
Specific Gravity, Urine: 1.01 (ref 1.005–1.030)
pH: 6 (ref 5.0–8.0)

## 2020-06-07 LAB — COMPREHENSIVE METABOLIC PANEL
ALT: 11 U/L (ref 0–44)
AST: 15 U/L (ref 15–41)
Albumin: 3.7 g/dL (ref 3.5–5.0)
Alkaline Phosphatase: 66 U/L (ref 38–126)
Anion gap: 9 (ref 5–15)
BUN: 13 mg/dL (ref 6–20)
CO2: 26 mmol/L (ref 22–32)
Calcium: 9.3 mg/dL (ref 8.9–10.3)
Chloride: 102 mmol/L (ref 98–111)
Creatinine, Ser: 0.72 mg/dL (ref 0.44–1.00)
GFR, Estimated: >60 mL/min (ref >60)
Glucose, Bld: 99 mg/dL (ref 70–99)
Potassium: 3.8 mmol/L (ref 3.5–5.1)
Sodium: 137 mmol/L (ref 135–145)
Total Bilirubin: 0.6 mg/dL (ref 0.3–1.2)
Total Protein: 7.3 g/dL (ref 6.5–8.1)

## 2020-06-07 LAB — CBC
HCT: 38.8 % (ref 36.0–46.0)
Hemoglobin: 12.3 g/dL (ref 12.0–15.0)
MCH: 29.6 pg (ref 26.0–34.0)
MCHC: 31.7 g/dL (ref 30.0–36.0)
MCV: 93.5 fL (ref 80.0–100.0)
Platelets: 268 10*3/uL (ref 150–400)
RBC: 4.15 MIL/uL (ref 3.87–5.11)
RDW: 14.4 % (ref 11.5–15.5)
WBC: 8.3 10*3/uL (ref 4.0–10.5)
nRBC: 0 % (ref 0.0–0.2)

## 2020-06-07 LAB — BRAIN NATRIURETIC PEPTIDE: B Natriuretic Peptide: 106.6 pg/mL — ABNORMAL HIGH (ref 0.0–100.0)

## 2020-06-07 LAB — LIPASE, BLOOD: Lipase: 31 U/L (ref 11–51)

## 2020-06-07 LAB — TROPONIN I (HIGH SENSITIVITY)
Troponin I (High Sensitivity): 10 ng/L (ref <18)
Troponin I (High Sensitivity): 10 ng/L (ref <18)

## 2020-06-07 LAB — D-DIMER, QUANTITATIVE: D-Dimer, Quant: 0.73 ug/mL-FEU — ABNORMAL HIGH (ref 0.00–0.50)

## 2020-06-07 MED ORDER — ONDANSETRON HCL 4 MG/2ML IJ SOLN
4.0000 mg | Freq: Once | INTRAMUSCULAR | Status: AC
  Administered 2020-06-07: 4 mg via INTRAVENOUS
  Filled 2020-06-07: qty 2

## 2020-06-07 MED ORDER — IOHEXOL 350 MG/ML SOLN
60.0000 mL | Freq: Once | INTRAVENOUS | Status: AC | PRN
  Administered 2020-06-07: 60 mL via INTRAVENOUS

## 2020-06-07 NOTE — ED Provider Notes (Signed)
3:30 PM signout from [place] PA-C at shift change.  Patient here with arm pain, and atypical sounding chest pain.  D-dimer was slightly elevated.  Currently awaiting CT angiogram of the chest to rule out PE and second troponin.  5:18 PM second troponin was stable.  CT performed without any complications and was negative.  Patient updated on results and she seems reassured.  She would like to go home today.  Strongly encouraged PCP and cardiology follow-up.  Patient was counseled to return with severe chest pain, especially if the pain is crushing or pressure-like and spreads to the arms, back, neck, or jaw, or if they have sweating, nausea, or shortness of breath with the pain. They were encouraged to call 911 with these symptoms.   BP 101/74   Pulse 76   Temp 98.4 F (36.9 C) (Oral)   Resp 19   Ht 4\' 11"  (1.499 m)   Wt 116.1 kg   SpO2 99%   BMI 51.71 kg/m     , PA-C 06/07/20 1718    08/07/20, MD 06/07/20 [phone]

## 2020-06-07 NOTE — Discharge Instructions (Addendum)
Your laboratory results were within normal limits. We discussed results of our findings.  You will need to ultimately follow-up with cardiology as discussed.

## 2020-06-07 NOTE — ED Provider Notes (Signed)
[facility] EMERGENCY DEPARTMENT Provider Note   CSN: [id number] Arrival date & time: 06/07/20  0825     History Chief Complaint  Patient presents with  . Arm Pain  . Nausea    Brooke Trujillo is a 50 y.o. female.  50 y.o female with a PMH of HTN, high cholesterol presents to the ED with a chief complaint of left arm pain of sudden onset last night.  Describes pain along the bicep aspect, he states this felt like a shooting pain radiating down her arm, states pain occurred when she was awake.  She attempted to go to sleep by not lying on that arm, the pain persisted.  States that this morning the pain intensified in nature.  She was at work this morning she began to feel very nauseated, states that she broke into a cold sweat but this later subsided.  While in the ED she has developed some substernal chest tightness which she describes as "like holding your breath before you are going to explode ".  Does report a similar symptoms in the past when she had a prior TIA.  Denies any prior history of cardiac disease, does have a family history sniffing for mother with an MI at the age of 75.  So endorses mild shortness of breath, this occurs while at rest but intensifies with ambulation.  No fevers, vomiting, sick contacts, abdominal pain.  Prior history of blood clots.     The history is provided by the patient and medical records.       Past Medical History:  Diagnosis Date  . High cholesterol   . Hypertension     There are no problems to display for this patient.   Past Surgical History:  Procedure Laterality Date  . ABDOMINAL HYSTERECTOMY    . CHOLECYSTECTOMY    . TUBAL LIGATION       OB History   No obstetric history on file.     No family history on file.  Social History   Tobacco Use  . Smoking status: Never Smoker  . Smokeless tobacco: Never Used  Vaping Use  . Vaping Use: Never used  Substance Use Topics  . Alcohol use: No  . Drug use:  No    Home Medications Prior to Admission medications   Medication Sig Start Date End Date Taking? Authorizing Provider  atorvastatin (LIPITOR) 10 MG tablet Take 10 mg by mouth daily.    [provider]  hydrochlorothiazide (HYDRODIURIL) 25 MG tablet TK 1 T PO QD 02/26/18   [provider]  hydrochlorothiazide (HYDRODIURIL) 50 MG tablet Take 50 mg by mouth daily. 09/27/16   [provider]  ibuprofen (ADVIL,MOTRIN) 200 MG tablet Take 200 mg by mouth every 6 (six) hours as needed for headache or mild pain.    [provider]  methocarbamol (ROBAXIN) 500 MG tablet Take 1 tablet (500 mg total) by mouth 2 (two) times daily. 04/26/19   Gailen Shelter, PA  olmesartan (BENICAR) 40 MG tablet Take 40 mg by mouth daily.    [provider]    Allergies    Aspirin, Contrast media [iodinated diagnostic agents], and Lisinopril  Review of Systems   Review of Systems  Constitutional: Negative for fever.  HENT: Negative for sore throat.   Respiratory: Positive for chest tightness and shortness of breath. Negative for cough.   Cardiovascular: Positive for chest pain.  Gastrointestinal: Positive for nausea. Negative for abdominal pain and vomiting.  Genitourinary: Negative  for flank pain.  Musculoskeletal: Negative for back pain.  Neurological: Negative for light-headedness and headaches.  All other systems reviewed and are negative.   Physical Exam Updated Vital Signs BP 112/74 (BP Location: Right Arm)   Pulse 77   Temp 98.4 F (36.9 C) (Oral)   Resp 18   Ht 4\' 11"  (1.499 m)   Wt 116.1 kg   SpO2 97%   BMI 51.71 kg/m   Physical Exam Vitals and nursing note reviewed.  Constitutional:      Appearance: Normal appearance. She is obese.  HENT:     Head: Normocephalic and atraumatic.     Mouth/Throat:     Mouth: Mucous membranes are moist.  Eyes:     Pupils: Pupils are equal, round, and reactive to light.  Cardiovascular:     Rate and Rhythm:  Normal rate.     Comments: No bilateral calf swelling, no bilateral pitting edema. Pulmonary:     Effort: Pulmonary effort is normal.     Breath sounds: No wheezing or rales.  Abdominal:     Palpations: Abdomen is soft.     Tenderness: There is no abdominal tenderness. There is no right CVA tenderness, left CVA tenderness or guarding.  Musculoskeletal:        General: No tenderness or deformity.     Left shoulder: Normal.     Cervical back: Normal range of motion and neck supple.     Comments: No pain with palpation or ranging of the left shoulder.  Reports pain along the left bicep which is not palpable with palpation.  Skin:    General: Skin is warm and dry.  Neurological:     Mental Status: She is alert and oriented to person, place, and time.     ED Results / Procedures / Treatments   Labs (all labs ordered are listed, but only abnormal results are displayed) Labs Reviewed  BRAIN NATRIURETIC PEPTIDE - Abnormal; Notable for the following components:      Result Value   B Natriuretic Peptide 106.6 (*)    All other components within normal limits  D-DIMER, QUANTITATIVE - Abnormal; Notable for the following components:   D-Dimer, Quant 0.73 (*)    All other components within normal limits  LIPASE, BLOOD  COMPREHENSIVE METABOLIC PANEL  CBC  URINALYSIS, ROUTINE W REFLEX MICROSCOPIC  TROPONIN I (HIGH SENSITIVITY)  TROPONIN I (HIGH SENSITIVITY)    EKG EKG Interpretation  Date/Time:  Wednesday Jun 07 2020 08:57:24 EDT Ventricular Rate:  77 PR Interval:  125 QRS Duration: 84 QT Interval:  392 QTC Calculation: 444 R Axis:   46 Text Interpretation: Sinus rhythm LAE, consider biatrial enlargement inferior mild ST depressions look similar to Jul 02, 2017though more pronounced than 06/05/17 Confirmed by 2020 [phone]) on 06/07/2020 9:05:29 AM   Radiology DG Chest 2 View  Result Date: 06/07/2020 CLINICAL DATA:  Shortness of breath EXAM: CHEST - 2 VIEW COMPARISON:   Radiograph and CT from 03/29/2018 FINDINGS: Cardiomediastinal silhouette is within normal limits. No new focal airspace disease. There is no large pleural effusion or visible pneumothorax. IMPRESSION: No evidence of acute cardiopulmonary disease. Electronically Signed   By: 03/31/2018   On: 06/07/2020 10:21    Procedures Procedures   Medications Ordered in ED Medications  ondansetron [facility]) injection 4 mg (4 mg Intravenous Given 06/07/20 0939)  ondansetron (ZOFRAN) injection 4 mg (4 mg Intravenous Given 06/07/20 1448)    ED Course  I have reviewed the triage vital  signs and the nursing notes.  Pertinent labs & imaging results that were available during my care of the patient were reviewed by me and considered in my medical decision making (see chart for details).  Clinical Course as of 06/07/20 1455  Wed Jun 07, 2020  1221 B Natriuretic Peptide(!): 106.6 [JS]    Clinical Course User Index [JS] Claude Manges, PA-C   MDM Rules/Calculators/A&P     Patient arrived to the ED hemodynamically stable, although blood pressure noted to be hyper tensive on arrival this later improved.  No tachycardia present she has been experiencing some left arm pain which began last night along with some chest tightness that occurred this morning, broke into sweats and began to feel nauseated.  No prior history of cardiac disease, does have significant family history of cardiac disease with mother with an MI at the age of 11.  Primary evaluation patient is overall well-appearing, in no distress with stable vital signs, lungs are clear to auscultation without any wheezing or rhonchi.  No pain with palpation of the chest, no pain with palpation of the left arm its not reproducible.  No calf tenderness or bilateral pitting edema.  Denies any prior history of CHF.  She also endorses some shortness of breath that occurs at rest but exacerbated with ambulation, oxygen saturation has been above 98% while in the ED on  room air.  Discussed due to risk factors along with atypical chest pain and left arm pain will obtain a cardiac work-up for further evaluation.  Interpretation of her labs reveal a CBC without any leukocytosis, no signs of anemia.  CMP without any electrolyte abnormality, creatinine level is within normal limits.  LFTs are unremarkable.  HEART SCORE 5  X-ray of her chest without any acute findings such as pneumonia, pleural effusion or pneumothorax.  No cardiomegaly.  BNP is 106, slightly elevated but prior history of CHF.  First troponin is 10, will obtain delta as patient's story sounds somewhat atypical.  Dimer level has also been obtained as patient reported the chest tightness with some radiation to her back.  No tachycardia or hypoxia on today's visit.  2:04 PM delta troponin remains flat without any changes.  D-dimer level was positive.  I discussed these results with patient at length, she does have a prior allergy to contrast under her chart, when questioned about this patient reports that she has not had any reaction to contrast in the past aside from nausea and vomiting, will be given Zofran prior to intervention.  She is agreeable of CT angio at this time.  Denies any prior anaphylaxis, shortness of breath or use of Epi pen in the past post contrast.   2:52 PM Spoke to [facility], who is aware patient had a previous CT imaging in the past with contrast.  Patient is currently 6 on the line to have CT evaluation.  Patient care signed out to incoming team pending CT angio.  Patient is aware she will likely disposition home if all findings are within normal limits.   Portions of this note were generated with Scientist, clinical (histocompatibility and immunogenetics). Dictation errors may occur despite best attempts at proofreading.  Final Clinical Impression(s) / ED Diagnoses Final diagnoses:  Atypical chest pain  Left arm pain    Rx / DC Orders ED Discharge Orders    None       Claude Manges,  PA-C 06/07/20 1455    Pricilla Loveless, MD 06/08/20 [phone]

## 2020-06-07 NOTE — ED Triage Notes (Signed)
Pt reports L arm pain, shooting from shoulder down to elbow since last night, denies injury or strenuous activity. Also reports nausea without vomiting onset this morning at work. Endorses mild shob.

## 2020-07-10 ENCOUNTER — Encounter: Payer: Self-pay | Admitting: Cardiology

## 2020-07-10 ENCOUNTER — Other Ambulatory Visit: Payer: Self-pay

## 2020-07-10 ENCOUNTER — Ambulatory Visit: Payer: BC Managed Care – PPO | Admitting: Cardiology

## 2020-07-10 ENCOUNTER — Ambulatory Visit (INDEPENDENT_AMBULATORY_CARE_PROVIDER_SITE_OTHER): Payer: BC Managed Care – PPO

## 2020-07-10 VITALS — BP 122/98 | HR 68 | Ht 59.0 in | Wt 261.1 lb

## 2020-07-10 DIAGNOSIS — R0602 Shortness of breath: Secondary | ICD-10-CM

## 2020-07-10 DIAGNOSIS — I10 Essential (primary) hypertension: Secondary | ICD-10-CM | POA: Diagnosis not present

## 2020-07-10 DIAGNOSIS — R002 Palpitations: Secondary | ICD-10-CM

## 2020-07-10 DIAGNOSIS — R600 Localized edema: Secondary | ICD-10-CM | POA: Diagnosis not present

## 2020-07-10 MED ORDER — CARVEDILOL 3.125 MG PO TABS: 3.1250 mg | ORAL_TABLET | Freq: Two times a day (BID) | ORAL | 3 refills | Status: DC

## 2020-07-10 NOTE — Patient Instructions (Addendum)
Medication Instructions:  Your physician has recommended you make the following change in your medication: STOP: Metoprolol START: Coreg 3.125 mg twice daily  *If you need a refill on your cardiac medications before your next appointment, please call your pharmacy*   Lab Work: None If you have labs (blood work) drawn today and your tests are completely normal, you will receive your results only by: Brooke Trujillo. MyChart Message (if you have MyChart) OR . A paper copy in the mail If you have any lab test that is abnormal or we need to change your treatment, we will call you to review the results.   Testing/Procedures: A zio monitor was ordered today. It will remain on for 14 days. You will then return monitor and event diary in provided box. It takes 1-2 weeks for report to be downloaded and returned to us. We will call you with the results. If monitor falls off or has orange flashing light, please call Zio for further instructions.   Your physician has requested that you have an echocardiogram. Echocardiography is a painless test that uses sound waves to create images of your heart. It provides your doctor with information about the size and shape of your heart and how well your heart's chambers and valves are working. This procedure takes approximately one hour. There are no restrictions for this procedure.    Follow-Up: [facility], you and your health needs are our priority.  As part of our continuing mission to provide you with exceptional heart care, we have created designated Provider Care Teams.  These Care Teams include your primary Cardiologist (physician) and Advanced Practice Providers (APPs -  Physician Assistants and Nurse Practitioners) who all work together to provide you with the care you need, when you need it.  We recommend signing up for the patient portal called "MyChart".  Sign up information is provided on this After Visit Summary.  MyChart is used to connect with patients  for Virtual Visits (Telemedicine).  Patients are able to view lab/test results, encounter notes, upcoming appointments, etc.  Non-urgent messages can be sent to your provider as well.   To learn more about what you can do with MyChart, go to [email or web address].    Your next appointment:   3 month(s)  The format for your next appointment:   In Person  Provider:   Thomasene RippleKardie Tobb, DO   Other Instructions ZIO  WHY IS MY DOCTOR PRESCRIBING ZIO? The Zio system is proven and trusted by physicians to detect and diagnose irregular heart rhythms -- and has been prescribed to hundreds of thousands of patients.  The FDA has cleared the Zio system to monitor for many different kinds of irregular heart rhythms. In a study, physicians were able to reach a diagnosis 90% of the time with the Zio system1.  You can wear the Zio monitor -- a small, discreet, comfortable patch -- during your normal day-to-day activity, including while you sleep, shower, and exercise, while it records every single heartbeat for analysis.  1Barrett, P., et al. Comparison of 24 Hour Holter Monitoring Versus 14 Day Novel Adhesive Patch Electrocardiographic Monitoring. American Journal of Medicine, 2014.  ZIO VS. HOLTER MONITORING The Zio monitor can be comfortably worn for up to 14 days. Holter monitors can be worn for 24 to 48 hours, limiting the time to record any irregular heart rhythms you may have. Zio is able to capture data for the 51% of patients who have their first symptom-triggered arrhythmia after 48 hours.1  LIVE  WITHOUT RESTRICTIONS The Zio ambulatory cardiac monitor is a small, unobtrusive, and water-resistant patch--you might even forget you're wearing it. The Zio monitor records and stores every beat of your heart, whether you're sleeping, working out, or showering. Remove on: June 13th 2022   Echocardiogram An echocardiogram is a test that uses sound waves (ultrasound) to produce images of the  heart. Images from an echocardiogram can provide important information about:  Heart size and shape.  The size and thickness and movement of your heart's walls.  Heart muscle function and strength.  Heart valve function or if you have stenosis. Stenosis is when the heart valves are too narrow.  If blood is flowing backward through the heart valves (regurgitation).  A tumor or infectious growth around the heart valves.  Areas of heart muscle that are not working well because of poor blood flow or injury from a heart attack.  Aneurysm detection. An aneurysm is a weak or damaged part of an artery wall. The wall bulges out from the normal force of blood pumping through the body. Tell a health care provider about:  Any allergies you have.  All medicines you are taking, including vitamins, herbs, eye drops, creams, and over-the-counter medicines.  Any blood disorders you have.  Any surgeries you have had.  Any medical conditions you have.  Whether you are pregnant or may be pregnant. What are the risks? Generally, this is a safe test. However, problems may occur, including an allergic reaction to dye (contrast) that may be used during the test. What happens before the test? No specific preparation is needed. You may eat and drink normally. What happens during the test?  You will take off your clothes from the waist up and put on a hospital gown.  Electrodes or electrocardiogram (ECG)patches may be placed on your chest. The electrodes or patches are then connected to a device that monitors your heart rate and rhythm.  You will lie down on a table for an ultrasound exam. A gel will be applied to your chest to help sound waves pass through your skin.  A handheld device, called a transducer, will be pressed against your chest and moved over your heart. The transducer produces sound waves that travel to your heart and bounce back (or "echo" back) to the transducer. These sound waves  will be captured in real-time and changed into images of your heart that can be viewed on a video monitor. The images will be recorded on a computer and reviewed by your health care provider.  You may be asked to change positions or hold your breath for a short time. This makes it easier to get different views or better views of your heart.  In some cases, you may receive contrast through an IV in one of your veins. This can improve the quality of the pictures from your heart. The procedure may vary among health care providers and hospitals.   What can I expect after the test? You may return to your normal, everyday life, including diet, activities, and medicines, unless your health care provider tells you not to do that. Follow these instructions at home:  It is up to you to get the results of your test. Ask your health care provider, or the department that is doing the test, when your results will be ready.  Keep all follow-up visits. This is important. Summary  An echocardiogram is a test that uses sound waves (ultrasound) to produce images of the heart.  Images from  an echocardiogram can provide important information about the size and shape of your heart, heart muscle function, heart valve function, and other possible heart problems.  You do not need to do anything to prepare before this test. You may eat and drink normally.  After the echocardiogram is completed, you may return to your normal, everyday life, unless your health care provider tells you not to do that. This information is not intended to replace advice given to you by your health care provider. Make sure you discuss any questions you have with your health care provider. Document Revised: 09/14/2019 Document Reviewed: 09/14/2019 Elsevier Patient Education  2021 Else[employer] Inc.   Mediterranean Diet A Mediterranean diet refers to food and lifestyle choices that are based on the traditions of countries located on the  [employer]. This way of eating has been shown to help prevent certain conditions and improve outcomes for people who have chronic diseases, like kidney disease and heart disease. What are tips for following this plan? Lifestyle  Cook and eat meals together with your family, when possible.  Drink enough fluid to keep your urine clear or pale yellow.  Be physically active every day. This includes: ? Aerobic exercise like running or swimming. ? Leisure activities like gardening, walking, or housework.  Get 7-8 hours of sleep each night.  If recommended by your health care provider, drink red wine in moderation. This means 1 glass a day for nonpregnant women and 2 glasses a day for men. A glass of wine equals 5 oz (150 mL). Reading food labels  Check the serving size of packaged foods. For foods such as rice and pasta, the serving size refers to the amount of cooked product, not dry.  Check the total fat in packaged foods. Avoid foods that have saturated fat or trans fats.  Check the ingredients list for added sugars, such as corn syrup.   Shopping  At the grocery store, buy most of your food from the areas near the walls of the store. This includes: ? Fresh fruits and vegetables (produce). ? Grains, beans, nuts, and seeds. Some of these may be available in unpackaged forms or large amounts (in bulk). ? Fresh seafood. ? Poultry and eggs. ? Low-fat dairy products.  Buy whole ingredients instead of prepackaged foods.  Buy fresh fruits and vegetables in-season from local farmers markets.  Buy frozen fruits and vegetables in resealable bags.  If you do not have access to quality fresh seafood, buy precooked frozen shrimp or canned fish, such as tuna, salmon, or sardines.  Buy small amounts of raw or cooked vegetables, salads, or olives from the deli or salad bar at your store.  Stock your pantry so you always have certain foods on hand, such as olive oil, canned tuna, canned  tomatoes, rice, pasta, and beans. Cooking  Cook foods with extra-virgin olive oil instead of using butter or other vegetable oils.  Have meat as a side dish, and have vegetables or grains as your main dish. This means having meat in small portions or adding small amounts of meat to foods like pasta or stew.  Use beans or vegetables instead of meat in common dishes like chili or lasagna.  Experiment with different cooking methods. Try roasting or broiling vegetables instead of steaming or sauteing them.  Add frozen vegetables to soups, stews, pasta, or rice.  Add nuts or seeds for added healthy fat at each meal. You can add these to yogurt, salads, or vegetable dishes.  Marinate  fish or vegetables using olive oil, lemon juice, garlic, and fresh herbs. Meal planning  Plan to eat 1 vegetarian meal one day each week. Try to work up to 2 vegetarian meals, if possible.  Eat seafood 2 or more times a week.  Have healthy snacks readily available, such as: ? Vegetable sticks with hummus. ? [place] yogurt. ? Fruit and nut trail mix.  Eat balanced meals throughout the week. This includes: ? Fruit: 2-3 servings a day ? Vegetables: 4-5 servings a day ? Low-fat dairy: 2 servings a day ? Fish, poultry, or lean meat: 1 serving a day ? Beans and legumes: 2 or more servings a week ? Nuts and seeds: 1-2 servings a day ? Whole grains: 6-8 servings a day ? Extra-virgin olive oil: 3-4 servings a day  Limit red meat and sweets to only a few servings a month   What are my food choices?  Mediterranean diet ? Recommended  Grains: Whole-grain pasta. Brown rice. Bulgar wheat. Polenta. Couscous. Whole-wheat bread. Orpah Cobb.  Vegetables: Artichokes. Beets. Broccoli. Cabbage. Carrots. Eggplant. Green beans. Chard. Kale. Spinach. Onions. Leeks. Peas. Squash. Tomatoes. Peppers. Radishes.  Fruits: Apples. Apricots. Avocado. Berries. Bananas. Cherries. Dates. Figs. Grapes. Lemons. Melon. Oranges.  Peaches. Plums. Pomegranate.  Meats and other protein foods: Beans. Almonds. Sunflower seeds. Pine nuts. Peanuts. Cod. Salmon. Scallops. Shrimp. Tuna. Tilapia. Clams. Oysters. Eggs.  Dairy: Low-fat milk. Cheese. Greek yogurt.  Beverages: Water. Red wine. Herbal tea.  Fats and oils: Extra virgin olive oil. Avocado oil. Grape seed oil.  Sweets and desserts: [place] yogurt with honey. Baked apples. Poached pears. Trail mix.  Seasoning and other foods: Basil. Cilantro. Coriander. Cumin. Mint. Parsley. Sage. Rosemary. Tarragon. Garlic. Oregano. Thyme. Pepper. Balsalmic vinegar. Tahini. Hummus. Tomato sauce. Olives. Mushrooms. ? Limit these  Grains: Prepackaged pasta or rice dishes. Prepackaged cereal with added sugar.  Vegetables: Deep fried potatoes (french fries).  Fruits: Fruit canned in syrup.  Meats and other protein foods: Beef. Pork. Lamb. Poultry with skin. Hot dogs. Tomasa Blase.  Dairy: Ice cream. Sour cream. Whole milk.  Beverages: Juice. Sugar-sweetened soft drinks. Beer. Liquor and spirits.  Fats and oils: Butter. Canola oil. Vegetable oil. Beef fat (tallow). Lard.  Sweets and desserts: Cookies. Cakes. Pies. Candy.  Seasoning and other foods: Mayonnaise. Premade sauces and marinades. The items listed may not be a complete list. Talk with your dietitian about what dietary choices are right for you. Summary  The Mediterranean diet includes both food and lifestyle choices.  Eat a variety of fresh fruits and vegetables, beans, nuts, seeds, and whole grains.  Limit the amount of red meat and sweets that you eat.  Talk with your health care provider about whether it is safe for you to drink red wine in moderation. This means 1 glass a day for nonpregnant women and 2 glasses a day for men. A glass of wine equals 5 oz (150 mL). This information is not intended to replace advice given to you by your health care provider. Make sure you discuss any questions you have with your health  care provider. Document Revised: 09/21/2015 Document Reviewed: 09/14/2015 Elsevier Patient Education  2020 [employer].

## 2020-07-10 NOTE — Progress Notes (Signed)
Cardiology Office Note:    Date:  07/10/2020   ID:  Brooke Trujillo, DOB 01/23/1971, MRN [id number]  PCP:  Clide DalesWright, Morgan Dionne, PA  Cardiologist:  None  Electrophysiologist:  None   Referring MD: Claude MangesSoto, Johana, PA-C   " I had some chest pain"   History of Present Illness:    Brooke Trujillo is a 50 y.o. female with a hx of hypertension, obesity, family history of premature coronary with mother undergoing coronary bypass surgery at age 52 is here today to be evaluated for palpitations the patient tells me that she has had worsening shortness of breath exertion over the last several weeks.  She also notes that she has had intermittent palpitations but really made her to be referred to cardiology is done on May 4 she was at work she had some palpitations felt like a squeezing reported in chest pain associated with this.  She did have associated nausea as well as sweating and clamminess.  She came to the emergency department and she was tested troponin was negative EKG was not impressive she was asked to follow-up with cardiology.  Since that visit she has not had this issue since but she has had intermittent palpitations and shortness of breath.  Past Medical History:  Diagnosis Date  . High cholesterol   . Hypertension     Past Surgical History:  Procedure Laterality Date  . ABDOMINAL HYSTERECTOMY    . CHOLECYSTECTOMY    . TUBAL LIGATION      Current Medications: Current Meds  Medication Sig  . atorvastatin (LIPITOR) 10 MG tablet Take 10 mg by mouth daily.  . carvedilol (COREG) 3.125 MG tablet Take 1 tablet (3.125 mg total) by mouth 2 (two) times daily.  . hydrochlorothiazide (HYDRODIURIL) 25 MG tablet TK 1 T PO QD  . hydrochlorothiazide (HYDRODIURIL) 50 MG tablet Take 50 mg by mouth daily.  Marland Kitchen. ibuprofen (ADVIL,MOTRIN) 200 MG tablet Take 200 mg by mouth every 6 (six) hours as needed for headache or mild pain.  . methocarbamol (ROBAXIN) 500 MG tablet Take 1 tablet (500 mg total)  by mouth 2 (two) times daily.  . [DISCONTINUED] metoprolol succinate (TOPROL-XL) 25 MG 24 hr tablet Take 1 tablet by mouth daily.     Allergies:   Amlodipine, Aspirin, Olmesartan, Contrast media [iodinated diagnostic agents], Lisinopril, and Iodine   Social History   Socioeconomic History  . Marital status: Single    Spouse name: Not on file  . Number of children: Not on file  . Years of education: Not on file  . Highest education level: Not on file  Occupational History  . Not on file  Tobacco Use  . Smoking status: Never Smoker  . Smokeless tobacco: Never Used  Vaping Use  . Vaping Use: Never used  Substance and Sexual Activity  . Alcohol use: No  . Drug use: No  . Sexual activity: Never    Birth control/protection: Surgical  Other Topics Concern  . Not on file  Social History Narrative  . Not on file   Social Determinants of Health   Financial Resource Strain: Not on file  Food Insecurity: Not on file  Transportation Needs: Not on file  Physical Activity: Not on file  Stress: Not on file  Social Connections: Not on file     Family History: The patient's family history is not on file.  ROS:   Review of Systems  Constitution: Negative for decreased appetite, fever and weight gain.  HENT: Negative for congestion,  ear discharge, hoarse voice and sore throat.   Eyes: Negative for discharge, redness, vision loss in right eye and visual halos.  Cardiovascular: Reports dyspnea exertion, chest pain and palpitations.  Leg swelling, orthopnea.  Respiratory: Negative for cough, hemoptysis, shortness of breath and snoring.   Endocrine: Negative for heat intolerance and polyphagia.  Hematologic/Lymphatic: Negative for bleeding problem. Does not bruise/bleed easily.  Skin: Negative for flushing, nail changes, rash and suspicious lesions.  Musculoskeletal: Negative for arthritis, joint pain, muscle cramps, myalgias, neck pain and stiffness.  Gastrointestinal: Negative for  abdominal pain, bowel incontinence, diarrhea and excessive appetite.  Genitourinary: Negative for decreased libido, genital sores and incomplete emptying.  Neurological: Negative for brief paralysis, focal weakness, headaches and loss of balance.  Psychiatric/Behavioral: Negative for altered mental status, depression and suicidal ideas.  Allergic/Immunologic: Negative for HIV exposure and persistent infections.    EKGs/Labs/Other Studies Reviewed:    The following studies were reviewed today:   EKG:  The ekg ordered today demonstrates sinus rhythm, heart rate 68 bpm. Suggesting left atrial lodgment.   Recent Labs: 06/07/2020: ALT 11; B Natriuretic Peptide 106.6; BUN 13; Creatinine, Ser 0.72; Hemoglobin 12.3; Platelets 268; Potassium 3.8; Sodium 137  Recent Lipid Panel No results found for: CHOL, TRIG, HDL, CHOLHDL, VLDL, LDLCALC, LDLDIRECT  Physical Exam:    VS:  BP (!) 122/98 (BP Location: Left Arm)   Pulse 68   Ht 4\' 11"  (1.499 m)   Wt 261 lb 1.3 oz (118.4 kg)   SpO2 98%   BMI 52.73 kg/m     Wt Readings from Last 3 Encounters:  07/10/20 261 lb 1.3 oz (118.4 kg)  06/07/20 256 lb (116.1 kg)  04/26/19 252 lb (114.3 kg)     GEN: Well nourished, well developed in no acute distress HEENT: Normal NECK: No JVD; No carotid bruits LYMPHATICS: No lymphadenopathy CARDIAC: S1S2 noted,RRR, no murmurs, rubs, gallops RESPIRATORY:  Clear to auscultation without rales, wheezing or rhonchi  ABDOMEN: Soft, non-tender, non-distended, +bowel sounds, no guarding. EXTREMITIES: No edema, No cyanosis, no clubbing MUSCULOSKELETAL:  No deformity  SKIN: Warm and dry NEUROLOGIC:  Alert and oriented x 3, non-focal PSYCHIATRIC:  Normal affect, good insight  ASSESSMENT:    1. SOB (shortness of breath)   2. Hypertension, unspecified type   3. Morbid obesity (HCC)   4. Bilateral leg edema   5. Palpitations    PLAN:     I would like to rule out a cardiovascular etiology of this palpitation,  therefore at this time I would like to placed a zio patch for 14 days. In additon a transthoracic echocardiogram will be ordered to assess LV/RV function and any structural abnormalities. Once these testing have been performed amd reviewed further reccomendations will be made. For now, I do reccomend that the patient goes to the nearest ED if  symptoms recur.  She also does have diastolic hypertension in the office today with a lower heart rate and we will stop her Toprol-XL and start the patient on carvedilol 3.125 twice daily which will help with her blood pressure and not affecting her heart rate as much.  The patient understands the need to lose weight with diet and exercise. We have discussed specific strategies for this.  The patient is in agreement with the above plan. The patient left the office in stable condition.  The patient will follow up in 3 months or sooner if needed.   Medication Adjustments/Labs and Tests Ordered: Current medicines are reviewed at length with the  patient today.  Concerns regarding medicines are outlined above.  Orders Placed This Encounter  Procedures  . LONG TERM MONITOR (3-14 DAYS)  . EKG 12-Lead  . ECHOCARDIOGRAM COMPLETE   Meds ordered this encounter  Medications  . carvedilol (COREG) 3.125 MG tablet    Sig: Take 1 tablet (3.125 mg total) by mouth 2 (two) times daily.    Dispense:  180 tablet    Refill:  3    Patient Instructions   Medication Instructions:  Your physician has recommended you make the following change in your medication: STOP: Metoprolol START: Coreg 3.125 mg twice daily  *If you need a refill on your cardiac medications before your next appointment, please call your pharmacy*   Lab Work: None If you have labs (blood work) drawn today and your tests are completely normal, you will receive your results only by: Marland Kitchen MyChart Message (if you have MyChart) OR . A paper copy in the mail If you have any lab test that is abnormal or  we need to change your treatment, we will call you to review the results.   Testing/Procedures: A zio monitor was ordered today. It will remain on for 14 days. You will then return monitor and event diary in provided box. It takes 1-2 weeks for report to be downloaded and returned to [place]. We will call you with the results. If monitor falls off or has orange flashing light, please call Zio for further instructions.   Your physician has requested that you have an echocardiogram. Echocardiography is a painless test that uses sound waves to create images of your heart. It provides your doctor with information about the size and shape of your heart and how well your heart's chambers and valves are working. This procedure takes approximately one hour. There are no restrictions for this procedure.    Follow-Up: At [facility], you and your health needs are our priority.  As part of our continuing mission to provide you with exceptional heart care, we have created designated Provider Care Teams.  These Care Teams include your primary Cardiologist (physician) and Advanced Practice Providers (APPs -  Physician Assistants and Nurse Practitioners) who all work together to provide you with the care you need, when you need it.  We recommend signing up for the patient portal called "MyChart".  Sign up information is provided on this After Visit Summary.  MyChart is used to connect with patients for Virtual Visits (Telemedicine).  Patients are able to view lab/test results, encounter notes, upcoming appointments, etc.  Non-urgent messages can be sent to your provider as well.   To learn more about what you can do with MyChart, go to [email or web address].    Your next appointment:   3 month(s)  The format for your next appointment:   In Person  Provider:   Thomasene Ripple, DO   Other Instructions ZIO  WHY IS MY DOCTOR PRESCRIBING ZIO? The Zio system is proven and trusted by physicians to detect and  diagnose irregular heart rhythms -- and has been prescribed to hundreds of thousands of patients.  The FDA has cleared the Zio system to monitor for many different kinds of irregular heart rhythms. In a study, physicians were able to reach a diagnosis 90% of the time with the Zio system1.  You can wear the Zio monitor -- a small, discreet, comfortable patch -- during your normal day-to-day activity, including while you sleep, shower, and exercise, while it records every single heartbeat for analysis.  1Barrett,  P., et al. Comparison of 24 Hour Holter Monitoring Versus 14 Day Novel Adhesive Patch Electrocardiographic Monitoring. American Journal of Medicine, 2014.  ZIO VS. HOLTER MONITORING The Zio monitor can be comfortably worn for up to 14 days. Holter monitors can be worn for 24 to 48 hours, limiting the time to record any irregular heart rhythms you may have. Zio is able to capture data for the 51% of patients who have their first symptom-triggered arrhythmia after 48 hours.1  LIVE WITHOUT RESTRICTIONS The Zio ambulatory cardiac monitor is a small, unobtrusive, and water-resistant patch--you might even forget you're wearing it. The Zio monitor records and stores every beat of your heart, whether you're sleeping, working out, or showering. Remove on: June 13th 2022   Echocardiogram An echocardiogram is a test that uses sound waves (ultrasound) to produce images of the heart. Images from an echocardiogram can provide important information about:  Heart size and shape.  The size and thickness and movement of your heart's walls.  Heart muscle function and strength.  Heart valve function or if you have stenosis. Stenosis is when the heart valves are too narrow.  If blood is flowing backward through the heart valves (regurgitation).  A tumor or infectious growth around the heart valves.  Areas of heart muscle that are not working well because of poor blood flow or injury from a heart  attack.  Aneurysm detection. An aneurysm is a weak or damaged part of an artery wall. The wall bulges out from the normal force of blood pumping through the body. Tell a health care provider about:  Any allergies you have.  All medicines you are taking, including vitamins, herbs, eye drops, creams, and over-the-counter medicines.  Any blood disorders you have.  Any surgeries you have had.  Any medical conditions you have.  Whether you are pregnant or may be pregnant. What are the risks? Generally, this is a safe test. However, problems may occur, including an allergic reaction to dye (contrast) that may be used during the test. What happens before the test? No specific preparation is needed. You may eat and drink normally. What happens during the test?  You will take off your clothes from the waist up and put on a hospital gown.  Electrodes or electrocardiogram (ECG)patches may be placed on your chest. The electrodes or patches are then connected to a device that monitors your heart rate and rhythm.  You will lie down on a table for an ultrasound exam. A gel will be applied to your chest to help sound waves pass through your skin.  A handheld device, called a transducer, will be pressed against your chest and moved over your heart. The transducer produces sound waves that travel to your heart and bounce back (or "echo" back) to the transducer. These sound waves will be captured in real-time and changed into images of your heart that can be viewed on a video monitor. The images will be recorded on a computer and reviewed by your health care provider.  You may be asked to change positions or hold your breath for a short time. This makes it easier to get different views or better views of your heart.  In some cases, you may receive contrast through an IV in one of your veins. This can improve the quality of the pictures from your heart. The procedure may vary among health care providers  and hospitals.   What can I expect after the test? You may return to your normal, everyday  life, including diet, activities, and medicines, unless your health care provider tells you not to do that. Follow these instructions at home:  It is up to you to get the results of your test. Ask your health care provider, or the department that is doing the test, when your results will be ready.  Keep all follow-up visits. This is important. Summary  An echocardiogram is a test that uses sound waves (ultrasound) to produce images of the heart.  Images from an echocardiogram can provide important information about the size and shape of your heart, heart muscle function, heart valve function, and other possible heart problems.  You do not need to do anything to prepare before this test. You may eat and drink normally.  After the echocardiogram is completed, you may return to your normal, everyday life, unless your health care provider tells you not to do that. This information is not intended to replace advice given to you by your health care provider. Make sure you discuss any questions you have with your health care provider. Document Revised: 09/14/2019 Document Reviewed: 09/14/2019 Elsevier Patient Education  2021 Elsevier Inc.   Mediterranean Diet A Mediterranean diet refers to food and lifestyle choices that are based on the traditions of countries located on the [place]. This way of eating has been shown to help prevent certain conditions and improve outcomes for people who have chronic diseases, like kidney disease and heart disease. What are tips for following this plan? Lifestyle  Cook and eat meals together with your family, when possible.  Drink enough fluid to keep your urine clear or pale yellow.  Be physically active every day. This includes: ? Aerobic exercise like running or swimming. ? Leisure activities like gardening, walking, or housework.  Get 7-8 hours of  sleep each night.  If recommended by your health care provider, drink red wine in moderation. This means 1 glass a day for nonpregnant women and 2 glasses a day for men. A glass of wine equals 5 oz (150 mL). Reading food labels  Check the serving size of packaged foods. For foods such as rice and pasta, the serving size refers to the amount of cooked product, not dry.  Check the total fat in packaged foods. Avoid foods that have saturated fat or trans fats.  Check the ingredients list for added sugars, such as corn syrup.   Shopping  At the grocery store, buy most of your food from the areas near the walls of the store. This includes: ? Fresh fruits and vegetables (produce). ? Grains, beans, nuts, and seeds. Some of these may be available in unpackaged forms or large amounts (in bulk). ? Fresh seafood. ? Poultry and eggs. ? Low-fat dairy products.  Buy whole ingredients instead of prepackaged foods.  Buy fresh fruits and vegetables in-season from local farmers markets.  Buy frozen fruits and vegetables in resealable bags.  If you do not have access to quality fresh seafood, buy precooked frozen shrimp or canned fish, such as tuna, salmon, or sardines.  Buy small amounts of raw or cooked vegetables, salads, or olives from the deli or salad bar at your store.  Stock your pantry so you always have certain foods on hand, such as olive oil, canned tuna, canned tomatoes, rice, pasta, and beans. Cooking  Cook foods with extra-virgin olive oil instead of using butter or other vegetable oils.  Have meat as a side dish, and have vegetables or grains as your main dish. This means having  meat in small portions or adding small amounts of meat to foods like pasta or stew.  Use beans or vegetables instead of meat in common dishes like chili or lasagna.  Experiment with different cooking methods. Try roasting or broiling vegetables instead of steaming or sauteing them.  Add frozen vegetables  to soups, stews, pasta, or rice.  Add nuts or seeds for added healthy fat at each meal. You can add these to yogurt, salads, or vegetable dishes.  Marinate fish or vegetables using olive oil, lemon juice, garlic, and fresh herbs. Meal planning  Plan to eat 1 vegetarian meal one day each week. Try to work up to 2 vegetarian meals, if possible.  Eat seafood 2 or more times a week.  Have healthy snacks readily available, such as: ? Vegetable sticks with hummus. ? [place] yogurt. ? Fruit and nut trail mix.  Eat balanced meals throughout the week. This includes: ? Fruit: 2-3 servings a day ? Vegetables: 4-5 servings a day ? Low-fat dairy: 2 servings a day ? Fish, poultry, or lean meat: 1 serving a day ? Beans and legumes: 2 or more servings a week ? Nuts and seeds: 1-2 servings a day ? Whole grains: 6-8 servings a day ? Extra-virgin olive oil: 3-4 servings a day  Limit red meat and sweets to only a few servings a month   What are my food choices?  Mediterranean diet ? Recommended  Grains: Whole-grain pasta. Brown rice. Bulgar wheat. Polenta. Couscous. Whole-wheat bread. Orpah Cobb.  Vegetables: Artichokes. Beets. Broccoli. Cabbage. Carrots. Eggplant. Green beans. Chard. Kale. Spinach. Onions. Leeks. Peas. Squash. Tomatoes. Peppers. Radishes.  Fruits: Apples. Apricots. Avocado. Berries. Bananas. Cherries. Dates. Figs. Grapes. Lemons. Melon. Oranges. Peaches. Plums. Pomegranate.  Meats and other protein foods: Beans. Almonds. Sunflower seeds. Pine nuts. Peanuts. Cod. Salmon. Scallops. Shrimp. Tuna. Tilapia. Clams. Oysters. Eggs.  Dairy: Low-fat milk. Cheese. Greek yogurt.  Beverages: Water. Red wine. Herbal tea.  Fats and oils: Extra virgin olive oil. Avocado oil. Grape seed oil.  Sweets and desserts: [place] yogurt with honey. Baked apples. Poached pears. Trail mix.  Seasoning and other foods: Basil. Cilantro. Coriander. Cumin. Mint. Parsley. Sage. Rosemary. Tarragon.  Garlic. Oregano. Thyme. Pepper. Balsalmic vinegar. Tahini. Hummus. Tomato sauce. Olives. Mushrooms. ? Limit these  Grains: Prepackaged pasta or rice dishes. Prepackaged cereal with added sugar.  Vegetables: Deep fried potatoes (french fries).  Fruits: Fruit canned in syrup.  Meats and other protein foods: Beef. Pork. Lamb. Poultry with skin. Hot dogs. Tomasa Blase.  Dairy: Ice cream. Sour cream. Whole milk.  Beverages: Juice. Sugar-sweetened soft drinks. Beer. Liquor and spirits.  Fats and oils: Butter. Canola oil. Vegetable oil. Beef fat (tallow). Lard.  Sweets and desserts: Cookies. Cakes. Pies. Candy.  Seasoning and other foods: Mayonnaise. Premade sauces and marinades. The items listed may not be a complete list. Talk with your dietitian about what dietary choices are right for you. Summary  The Mediterranean diet includes both food and lifestyle choices.  Eat a variety of fresh fruits and vegetables, beans, nuts, seeds, and whole grains.  Limit the amount of red meat and sweets that you eat.  Talk with your health care provider about whether it is safe for you to drink red wine in moderation. This means 1 glass a day for nonpregnant women and 2 glasses a day for men. A glass of wine equals 5 oz (150 mL). This information is not intended to replace advice given to you by your health care provider. Make sure  you discuss any questions you have with your health care provider. Document Revised: 09/21/2015 Document Reviewed: 09/14/2015 Elsevier Patient Education  2020 [employer].      Adopting a Healthy Lifestyle.  Know what a healthy weight is for you (roughly BMI <25) and aim to maintain this   Aim for 7+ servings of fruits and vegetables daily   65-80+ fluid ounces of water or unsweet tea for healthy kidneys   Limit to max 1 drink of alcohol per day; avoid smoking/tobacco   Limit animal fats in diet for cholesterol and heart health - choose grass fed whenever available    Avoid highly processed foods, and foods high in saturated/trans fats   Aim for low stress - take time to unwind and care for your mental health   Aim for 150 min of moderate intensity exercise weekly for heart health, and weights twice weekly for bone health   Aim for 7-9 hours of sleep daily   When it comes to diets, agreement about the perfect plan isnt easy to find, even among the experts. Experts at the [facility] [employer] developed an idea known as the Healthy Eating Plate. Just imagine a plate divided into logical, healthy portions.   The emphasis is on diet quality:   Load up on vegetables and fruits - one-half of your plate: Aim for color and variety, and remember that potatoes dont count.   Go for whole grains - one-quarter of your plate: Whole wheat, barley, wheat berries, quinoa, oats, brown rice, and foods made with them. If you want pasta, go with whole wheat pasta.   Protein power - one-quarter of your plate: Fish, chicken, beans, and nuts are all healthy, versatile protein sources. Limit red meat.   The diet, however, does go beyond the plate, offering a few other suggestions.   Use healthy plant oils, such as olive, canola, soy, corn, sunflower and peanut. Check the labels, and avoid partially hydrogenated oil, which have unhealthy trans fats.   If youre thirsty, drink water. Coffee and tea are good in moderation, but skip sugary drinks and limit milk and dairy products to one or two daily servings.   The type of carbohydrate in the diet is more important than the amount. Some sources of carbohydrates, such as vegetables, fruits, whole grains, and beans-are healthier than others.   Finally, stay active  Signed, Thomasene Ripple, DO  07/10/2020 4:54 PM    [facility]

## 2020-07-17 DIAGNOSIS — R002 Palpitations: Secondary | ICD-10-CM | POA: Diagnosis not present

## 2020-07-27 ENCOUNTER — Ambulatory Visit (HOSPITAL_COMMUNITY): Payer: BC Managed Care – PPO | Attending: Cardiology

## 2020-07-27 ENCOUNTER — Other Ambulatory Visit: Payer: Self-pay

## 2020-07-27 DIAGNOSIS — R0602 Shortness of breath: Secondary | ICD-10-CM | POA: Insufficient documentation

## 2020-07-27 LAB — ECHOCARDIOGRAM COMPLETE
Area-P 1/2: 6.27 cm2
S' Lateral: 2.5 cm

## 2020-07-31 ENCOUNTER — Telehealth: Payer: Self-pay | Admitting: Cardiology

## 2020-07-31 NOTE — Telephone Encounter (Signed)
Patient returning call for echo results. 

## 2020-07-31 NOTE — Telephone Encounter (Signed)
Called patient, see chart.  

## 2020-10-10 ENCOUNTER — Other Ambulatory Visit: Payer: Self-pay

## 2020-10-10 ENCOUNTER — Encounter: Payer: Self-pay | Admitting: Cardiology

## 2020-10-10 ENCOUNTER — Ambulatory Visit: Payer: BC Managed Care – PPO | Admitting: Cardiology

## 2020-10-10 VITALS — BP 130/80 | HR 86 | Ht 59.0 in | Wt 253.0 lb

## 2020-10-10 DIAGNOSIS — I10 Essential (primary) hypertension: Secondary | ICD-10-CM

## 2020-10-10 DIAGNOSIS — I472 Ventricular tachycardia: Secondary | ICD-10-CM

## 2020-10-10 DIAGNOSIS — R079 Chest pain, unspecified: Secondary | ICD-10-CM | POA: Diagnosis not present

## 2020-10-10 DIAGNOSIS — Z79899 Other long term (current) drug therapy: Secondary | ICD-10-CM | POA: Diagnosis not present

## 2020-10-10 DIAGNOSIS — I4729 Other ventricular tachycardia: Secondary | ICD-10-CM | POA: Insufficient documentation

## 2020-10-10 MED ORDER — HYDROCHLOROTHIAZIDE 25 MG PO TABS: 25.0000 mg | ORAL_TABLET | Freq: Every day | ORAL | 3 refills | Status: DC

## 2020-10-10 MED ORDER — CARVEDILOL 12.5 MG PO TABS: 12.5000 mg | ORAL_TABLET | Freq: Two times a day (BID) | ORAL | 3 refills | Status: DC

## 2020-10-10 NOTE — Progress Notes (Signed)
Cardiology Office Note:    Date:  10/10/2020   ID:  Brooke Trujillo, DOB [birth year], MRN [id number]  PCP:  Adolph Pollack, FNP  Cardiologist:  None  Electrophysiologist:  None   Referring MD: Clide Dales, *   " I am doing better"  History of Present Illness:    Brooke Trujillo is a 50 y.o. female with a hx of hypertension, hyperlipidemia obesity here today for follow-up visit.  Did see the patient on July 10, 2020 at that time she reported that she had been experiencing intermittent palpitations.    Prior to her visit has been seen in the emergency department for palpitations and associated chest discomfort.  I placed a monitor on the patient as well as get an echocardiogram given her shortness of breath as well.  She did get her testing done.  Her cardiogram did not show any significant abnormalities.  Her ZIO monitor showed 1 run of nonsustained ventricular tachycardia.  Since I saw the patient her hydrochlorothiazide has been increased by her PCP to 50 mg daily.  She has had 1 episode of chest discomfort which she described as a sharp pain which are associated with taking deep breaths.  This has since resolved.  No other complaints at this time.  Past Medical History:  Diagnosis Date   High cholesterol    Hypertension     Past Surgical History:  Procedure Laterality Date   ABDOMINAL HYSTERECTOMY     CHOLECYSTECTOMY     TUBAL LIGATION      Current Medications: Current Meds  Medication Sig   atorvastatin (LIPITOR) 40 MG tablet Take 1 tablet by mouth at bedtime.   carvedilol (COREG) 12.5 MG tablet Take 1 tablet (12.5 mg total) by mouth 2 (two) times daily.   hydrochlorothiazide (HYDRODIURIL) 25 MG tablet Take 1 tablet (25 mg total) by mouth daily.   ibuprofen (ADVIL,MOTRIN) 200 MG tablet Take 200 mg by mouth every 6 (six) hours as needed for headache or mild pain.   meloxicam (MOBIC) 15 MG tablet Take 15 mg by mouth daily.   methocarbamol (ROBAXIN) 500 MG  tablet Take 1 tablet (500 mg total) by mouth 2 (two) times daily.   Semaglutide,0.25 or 0.5MG /DOS, (OZEMPIC, 0.25 OR 0.5 MG/DOSE,) 2 MG/1.5ML SOPN Inject 0.5 mg into the skin once a week.   Vitamin D, Ergocalciferol, (DRISDOL) 1.25 MG (50000 UNIT) CAPS capsule Take 50,000 Units by mouth once a week.   [DISCONTINUED] carvedilol (COREG) 3.125 MG tablet Take 1 tablet (3.125 mg total) by mouth 2 (two) times daily.   [DISCONTINUED] hydrochlorothiazide (HYDRODIURIL) 50 MG tablet Take 50 mg by mouth daily.     Allergies:   Amlodipine, Aspirin, Olmesartan, Contrast media [iodinated diagnostic agents], Lisinopril, and Iodine   Social History   Socioeconomic History   Marital status: Single    Spouse name: Not on file   Number of children: Not on file   Years of education: Not on file   Highest education level: Not on file  Occupational History   Not on file  Tobacco Use   Smoking status: Never   Smokeless tobacco: Never  Vaping Use   Vaping Use: Never used  Substance and Sexual Activity   Alcohol use: No   Drug use: No   Sexual activity: Never    Birth control/protection: Surgical  Other Topics Concern   Not on file  Social History Narrative   Not on file   Social Determinants of Health   Financial Resource Strain:  Not on file  Food Insecurity: Not on file  Transportation Needs: Not on file  Physical Activity: Not on file  Stress: Not on file  Social Connections: Not on file     Family History: The patient's family history includes Colon cancer in her mother; Congestive Heart Failure (age of onset: 26) in her mother; Coronary artery disease in her mother; Diabetes in her mother; Healthy in her brother, brother, father, sister, and sister; Hypertension in her brother and brother.  ROS:   Review of Systems  Constitution: Negative for decreased appetite, fever and weight gain.  HENT: Negative for congestion, ear discharge, hoarse voice and sore throat.   Eyes: Negative for  discharge, redness, vision loss in right eye and visual halos.  Cardiovascular: Negative for chest pain, dyspnea on exertion, leg swelling, orthopnea and palpitations.  Respiratory: Negative for cough, hemoptysis, shortness of breath and snoring.   Endocrine: Negative for heat intolerance and polyphagia.  Hematologic/Lymphatic: Negative for bleeding problem. Does not bruise/bleed easily.  Skin: Negative for flushing, nail changes, rash and suspicious lesions.  Musculoskeletal: Negative for arthritis, joint pain, muscle cramps, myalgias, neck pain and stiffness.  Gastrointestinal: Negative for abdominal pain, bowel incontinence, diarrhea and excessive appetite.  Genitourinary: Negative for decreased libido, genital sores and incomplete emptying.  Neurological: Negative for brief paralysis, focal weakness, headaches and loss of balance.  Psychiatric/Behavioral: Negative for altered mental status, depression and suicidal ideas.  Allergic/Immunologic: Negative for HIV exposure and persistent infections.    EKGs/Labs/Other Studies Reviewed:    The following studies were reviewed today:   EKG: None today  Transthoracic echocardiogram July 27, 2020 IMPRESSIONS     1. Left ventricular ejection fraction, by estimation, is 60 to 65%. The  left ventricle has normal function. The left ventricle has no regional  wall motion abnormalities. Left ventricular diastolic parameters were  normal.   2. Right ventricular systolic function is normal. The right ventricular  size is normal.   3. The mitral valve is normal in structure. No evidence of mitral valve  regurgitation. No evidence of mitral stenosis.   4. The aortic valve is tricuspid. Aortic valve regurgitation is not  visualized. No aortic stenosis is present.   5. The inferior vena cava is normal in size with greater than 50%  respiratory variability, suggesting right atrial pressure of 3 mmHg.   Comparison(s): No prior Echocardiogram.    Conclusion(s)/Recommendation(s): Normal biventricular function without  evidence of hemodynamically significant valvular heart disease.   FINDINGS   Left Ventricle: Left ventricular ejection fraction, by estimation, is 60  to 65%. The left ventricle has normal function. The left ventricle has no  regional wall motion abnormalities. The left ventricular internal cavity  size was normal in size. There is   no left ventricular hypertrophy. Left ventricular diastolic parameters  were normal.   Right Ventricle: The right ventricular size is normal. No increase in  right ventricular wall thickness. Right ventricular systolic function is  normal.   Left Atrium: Left atrial size was normal in size.   Right Atrium: Right atrial size was normal in size.   Pericardium: There is no evidence of pericardial effusion.   Mitral Valve: The mitral valve is normal in structure. No evidence of  mitral valve regurgitation. No evidence of mitral valve stenosis.   Tricuspid Valve: The tricuspid valve is normal in structure. Tricuspid  valve regurgitation is trivial. No evidence of tricuspid stenosis.   Aortic Valve: The aortic valve is tricuspid. Aortic valve regurgitation is  not visualized. No aortic stenosis is present.   Pulmonic Valve: The pulmonic valve was not well visualized. Pulmonic valve  regurgitation is trivial. No evidence of pulmonic stenosis.   Aorta: The aortic root, ascending aorta and aortic arch are all  structurally normal, with no evidence of dilitation or obstruction.   Venous: The inferior vena cava is normal in size with greater than 50%  respiratory variability, suggesting right atrial pressure of 3 mmHg.   IAS/Shunts: The atrial septum is grossly normal.       Zio monitor July 24, 2020 Patch Wear Time:  7 days and 5 hours starting July 10, 2020. Indications: Palpitations   Patient had a minimal HR of 53 bpm, maximum HR of 141 bpm, and average HR of 82 bpm.    Predominant underlying rhythm was Sinus Rhythm.   1 run of Ventricular Tachycardia occurred lasting 5 beats with a max rate of 141 bpm (average 121 bpm).   Premature atrial complexes were rare. Premature ventricular complexes were rare.   Symptoms associated with premature ventricular complexes.   No pauses, no supraventricular tachycardia, no atrial fibrillation.   Conclusion: This study is remarkable for 5 beat run of nonsustained ventricular tachycardia.  Recent Labs: 06/07/2020: ALT 11; B Natriuretic Peptide 106.6; BUN 13; Creatinine, Ser 0.72; Hemoglobin 12.3; Platelets 268; Potassium 3.8; Sodium 137  Recent Lipid Panel No results found for: CHOL, TRIG, HDL, CHOLHDL, VLDL, LDLCALC, LDLDIRECT  Physical Exam:    VS:  BP 130/80 (BP Location: Left Wrist, Patient Position: Sitting)   Pulse 86   Ht 4\' 11"  (1.499 m)   Wt 253 lb 0.6 oz (114.8 kg)   SpO2 96%   BMI 51.11 kg/m     Wt Readings from Last 3 Encounters:  10/10/20 253 lb 0.6 oz (114.8 kg)  07/10/20 261 lb 1.3 oz (118.4 kg)  06/07/20 256 lb (116.1 kg)     GEN: Well nourished, well developed in no acute distress HEENT: Normal NECK: No JVD; No carotid bruits LYMPHATICS: No lymphadenopathy CARDIAC: S1S2 noted,RRR, no murmurs, rubs, gallops RESPIRATORY:  Clear to auscultation without rales, wheezing or rhonchi  ABDOMEN: Soft, non-tender, non-distended, +bowel sounds, no guarding. EXTREMITIES: No edema, No cyanosis, no clubbing MUSCULOSKELETAL:  No deformity  SKIN: Warm and dry NEUROLOGIC:  Alert and oriented x 3, non-focal PSYCHIATRIC:  Normal affect, good insight  ASSESSMENT:    1. NSVT (nonsustained ventricular tachycardia) (HCC)   2. Chest pain of uncertain etiology   3. Medication management   4. Morbid obesity (HCC)   5. Primary hypertension    PLAN:     We will increase her Coreg to 12.5 mg daily.  We will cut back down on hydrochlorothiazide to 25 mg daily.  We will plan to keep her blood pressure at  130/80 mmHg.  She will inform me if there is any lightheadedness or dizziness.  As well as she will continue to take her blood pressure daily and give me that information via MyChart.  Her chest pain does sound pleuritic and atypical.  She has not had any repeat episodes I have asked the patient to notify me via MyChart at which time we will consider ischemic evaluation.  The patient understands the need to lose weight with diet and exercise. We have discussed specific strategies for this.  We will get blood work today.  The patient is in agreement with the above plan. The patient left the office in stable condition.  The patient will follow up in  6 months or sooner if needed.   Medication Adjustments/Labs and Tests Ordered: Current medicines are reviewed at length with the patient today.  Concerns regarding medicines are outlined above.  Orders Placed This Encounter  Procedures   Basic Metabolic Panel (BMET)   Magnesium   Meds ordered this encounter  Medications   hydrochlorothiazide (HYDRODIURIL) 25 MG tablet    Sig: Take 1 tablet (25 mg total) by mouth daily.    Dispense:  90 tablet    Refill:  3   carvedilol (COREG) 12.5 MG tablet    Sig: Take 1 tablet (12.5 mg total) by mouth 2 (two) times daily.    Dispense:  180 tablet    Refill:  3    Patient Instructions  Medication Instructions:  Your physician has recommended you make the following change in your medication:  START: Coreg 12.5 mg twice daily START: Hydrochlorothiazide 25 mg once daily  **Take your blood pressure daily for 2 weeks and send readings in a MyChart message. If you experience lightheadedness or dizziness send a message as well. **  *If you need a refill on your cardiac medications before your next appointment, please call your pharmacy*   Lab Work: Your physician recommends that you return for lab work in:  TODAY: BMET, Mag If you have labs (blood work) drawn today and your tests are completely  normal, you will receive your results only by: MyChart Message (if you have MyChart) OR A paper copy in the mail If you have any lab test that is abnormal or we need to change your treatment, we will call you to review the results.   Testing/Procedures: None   Follow-Up: At [facility], you and your health needs are our priority.  As part of our continuing mission to provide you with exceptional heart care, we have created designated Provider Care Teams.  These Care Teams include your primary Cardiologist (physician) and Advanced Practice Providers (APPs -  Physician Assistants and Nurse Practitioners) who all work together to provide you with the care you need, when you need it.  We recommend signing up for the patient portal called "MyChart".  Sign up information is provided on this After Visit Summary.  MyChart is used to connect with patients for Virtual Visits (Telemedicine).  Patients are able to view lab/test results, encounter notes, upcoming appointments, etc.  Non-urgent messages can be sent to your provider as well.   To learn more about what you can do with MyChart, go to [email or web address].    Your next appointment:   6 month(s)  The format for your next appointment:   In Person  Provider:   Thomasene RippleKardie Nikolai Wilczak, DO [address], [place], [place] [address]    Other Instructions     Adopting a Healthy Lifestyle.  Know what a healthy weight is for you (roughly BMI <25) and aim to maintain this   Aim for 7+ servings of fruits and vegetables daily   65-80+ fluid ounces of water or unsweet tea for healthy kidneys   Limit to max 1 drink of alcohol per day; avoid smoking/tobacco   Limit animal fats in diet for cholesterol and heart health - choose grass fed whenever available   Avoid highly processed foods, and foods high in saturated/trans fats   Aim for low stress - take time to unwind and care for your mental health   Aim for 150 min of moderate intensity  exercise weekly for heart health, and weights twice weekly for bone health  Aim for 7-9 hours of sleep daily   When it comes to diets, agreement about the perfect plan isnt easy to find, even among the experts. Experts at the [facility] [employer] developed an idea known as the Healthy Eating Plate. Just imagine a plate divided into logical, healthy portions.   The emphasis is on diet quality:   Load up on vegetables and fruits - one-half of your plate: Aim for color and variety, and remember that potatoes dont count.   Go for whole grains - one-quarter of your plate: Whole wheat, barley, wheat berries, quinoa, oats, brown rice, and foods made with them. If you want pasta, go with whole wheat pasta.   Protein power - one-quarter of your plate: Fish, chicken, beans, and nuts are all healthy, versatile protein sources. Limit red meat.   The diet, however, does go beyond the plate, offering a few other suggestions.   Use healthy plant oils, such as olive, canola, soy, corn, sunflower and peanut. Check the labels, and avoid partially hydrogenated oil, which have unhealthy trans fats.   If youre thirsty, drink water. Coffee and tea are good in moderation, but skip sugary drinks and limit milk and dairy products to one or two daily servings.   The type of carbohydrate in the diet is more important than the amount. Some sources of carbohydrates, such as vegetables, fruits, whole grains, and beans-are healthier than others.   Finally, stay active  Signed, Thomasene Ripple, DO  10/10/2020 2:01 PM    [facility]

## 2020-10-10 NOTE — Patient Instructions (Addendum)
Medication Instructions:  Your physician has recommended you make the following change in your medication:  START: Coreg 12.5 mg twice daily START: Hydrochlorothiazide 25 mg once daily  **Take your blood pressure daily for 2 weeks and send readings in a MyChart message. If you experience lightheadedness or dizziness send a message as well. **  *If you need a refill on your cardiac medications before your next appointment, please call your pharmacy*   Lab Work: Your physician recommends that you return for lab work in:  TODAY: BMET, Mag If you have labs (blood work) drawn today and your tests are completely normal, you will receive your results only by: MyChart Message (if you have MyChart) OR A paper copy in the mail If you have any lab test that is abnormal or we need to change your treatment, we will call you to review the results.   Testing/Procedures: None   Follow-Up: At [facility], you and your health needs are our priority.  As part of our continuing mission to provide you with exceptional heart care, we have created designated Provider Care Teams.  These Care Teams include your primary Cardiologist (physician) and Advanced Practice Providers (APPs -  Physician Assistants and Nurse Practitioners) who all work together to provide you with the care you need, when you need it.  We recommend signing up for the patient portal called "MyChart".  Sign up information is provided on this After Visit Summary.  MyChart is used to connect with patients for Virtual Visits (Telemedicine).  Patients are able to view lab/test results, encounter notes, upcoming appointments, etc.  Non-urgent messages can be sent to your provider as well.   To learn more about what you can do with MyChart, go to [email or web address].    Your next appointment:   6 month(s)  The format for your next appointment:   In Person  Provider:   Thomasene Ripple, DO [address], [place], [place]  [address]    Other Instructions

## 2020-10-11 LAB — BASIC METABOLIC PANEL
BUN/Creatinine Ratio: 22 (ref 9–23)
BUN: 16 mg/dL (ref 6–24)
CO2: 26 mmol/L (ref 20–29)
Calcium: 9.4 mg/dL (ref 8.7–10.2)
Chloride: 101 mmol/L (ref 96–106)
Creatinine, Ser: 0.73 mg/dL (ref 0.57–1.00)
Glucose: 81 mg/dL (ref 65–99)
Potassium: 3.8 mmol/L (ref 3.5–5.2)
Sodium: 142 mmol/L (ref 134–144)
eGFR: 99 mL/min/{1.73_m2} (ref >59)

## 2020-10-11 LAB — MAGNESIUM: Magnesium: 2 mg/dL (ref 1.6–2.3)

## 2021-01-11 ENCOUNTER — Emergency Department (HOSPITAL_BASED_OUTPATIENT_CLINIC_OR_DEPARTMENT_OTHER): Payer: BC Managed Care – PPO

## 2021-01-11 ENCOUNTER — Encounter (HOSPITAL_BASED_OUTPATIENT_CLINIC_OR_DEPARTMENT_OTHER): Payer: Self-pay

## 2021-01-11 ENCOUNTER — Emergency Department (HOSPITAL_BASED_OUTPATIENT_CLINIC_OR_DEPARTMENT_OTHER)
Admission: EM | Admit: 2021-01-11 | Discharge: 2021-01-11 | Disposition: A | Discharge status: Home / Self Care | Payer: BC Managed Care – PPO | Attending: Emergency Medicine | Admitting: Emergency Medicine

## 2021-01-11 ENCOUNTER — Other Ambulatory Visit: Payer: Self-pay

## 2021-01-11 DIAGNOSIS — E876 Hypokalemia: Secondary | ICD-10-CM | POA: Insufficient documentation

## 2021-01-11 DIAGNOSIS — R5383 Other fatigue: Secondary | ICD-10-CM

## 2021-01-11 DIAGNOSIS — I10 Essential (primary) hypertension: Secondary | ICD-10-CM | POA: Insufficient documentation

## 2021-01-11 DIAGNOSIS — Z20822 Contact with and (suspected) exposure to covid-19: Secondary | ICD-10-CM | POA: Insufficient documentation

## 2021-01-11 DIAGNOSIS — R0789 Other chest pain: Secondary | ICD-10-CM | POA: Insufficient documentation

## 2021-01-11 DIAGNOSIS — Z79899 Other long term (current) drug therapy: Secondary | ICD-10-CM | POA: Insufficient documentation

## 2021-01-11 LAB — BASIC METABOLIC PANEL
Anion gap: 10 (ref 5–15)
BUN: 11 mg/dL (ref 6–20)
CO2: 30 mmol/L (ref 22–32)
Calcium: 9 mg/dL (ref 8.9–10.3)
Chloride: 98 mmol/L (ref 98–111)
Creatinine, Ser: 0.85 mg/dL (ref 0.44–1.00)
GFR, Estimated: >60 mL/min (ref >60)
Glucose, Bld: 96 mg/dL (ref 70–99)
Potassium: 2.9 mmol/L — ABNORMAL LOW (ref 3.5–5.1)
Sodium: 138 mmol/L (ref 135–145)

## 2021-01-11 LAB — CBC
HCT: 35.7 % — ABNORMAL LOW (ref 36.0–46.0)
Hemoglobin: 11.9 g/dL — ABNORMAL LOW (ref 12.0–15.0)
MCH: 29.8 pg (ref 26.0–34.0)
MCHC: 33.3 g/dL (ref 30.0–36.0)
MCV: 89.5 fL (ref 80.0–100.0)
Platelets: 266 10*3/uL (ref 150–400)
RBC: 3.99 MIL/uL (ref 3.87–5.11)
RDW: 13.4 % (ref 11.5–15.5)
WBC: 8 10*3/uL (ref 4.0–10.5)
nRBC: 0 % (ref 0.0–0.2)

## 2021-01-11 LAB — TROPONIN I (HIGH SENSITIVITY)
Troponin I (High Sensitivity): 6 ng/L (ref <18)
Troponin I (High Sensitivity): 6 ng/L (ref <18)

## 2021-01-11 LAB — RESP PANEL BY RT-PCR (FLU A&B, COVID) ARPGX2
Influenza A by PCR: NEGATIVE
Influenza B by PCR: NEGATIVE
SARS Coronavirus 2 by RT PCR: NEGATIVE

## 2021-01-11 LAB — URINALYSIS, ROUTINE W REFLEX MICROSCOPIC
Bilirubin Urine: NEGATIVE
Glucose, UA: NEGATIVE mg/dL
Hgb urine dipstick: NEGATIVE
Ketones, ur: NEGATIVE mg/dL
Leukocytes,Ua: NEGATIVE
Nitrite: NEGATIVE
Protein, ur: NEGATIVE mg/dL
Specific Gravity, Urine: 1.02 (ref 1.005–1.030)
pH: 6.5 (ref 5.0–8.0)

## 2021-01-11 MED ORDER — POTASSIUM CHLORIDE CRYS ER 20 MEQ PO TBCR: 20.0000 meq | EXTENDED_RELEASE_TABLET | Freq: Every day | ORAL | 0 refills | Status: AC

## 2021-01-11 MED ORDER — POTASSIUM CHLORIDE CRYS ER 20 MEQ PO TBCR
40.0000 meq | EXTENDED_RELEASE_TABLET | Freq: Once | ORAL | Status: AC
  Administered 2021-01-11: 40 meq via ORAL
  Filled 2021-01-11: qty 2

## 2021-01-11 NOTE — Discharge Instructions (Signed)
Take the potassium once a day for the next 3 days.  You need to have your potassium rechecked by your primary care doctor within the next week.  Follow-up with your cardiologist regarding the chest pain and palpitations.  Return here as needed for any worsening symptoms.

## 2021-01-11 NOTE — ED Provider Notes (Signed)
[facility] EM[facility]ENCY DEPARTMENT Provider Note   CSN: [id number] Arrival date & time: 01/11/21  1924     History Chief Complaint  Patient presents with   Abnormal Lab   Chest Pain    Brooke Trujillo is a 50 y.o. female.  Patient is a 50 year old female with a history of hypertension hyperlipidemia, nonsustained V. tach who presents with low potassium.  She was at her PCPs office today and was noted to have a potassium of 2.9 and was sent here for further evaluation.  She says she has been feeling generally fatigued over the last 2 to 3 weeks.  She has had some intermittent sharp chest pains and occasionally she feels some palpitations.  She does not get dizzy or feel presyncopal with these episodes.  She has no cough or cold symptoms.  No fevers.  No nausea vomiting or diarrhea.  She does have some urinary frequency.      Past Medical History:  Diagnosis Date   High cholesterol    Hypertension     Patient Active Problem List   Diagnosis Date Noted   NSVT (nonsustained ventricular tachycardia) 10/10/2020   Chest pain of uncertain etiology 99991111   Medication management 10/10/2020   Morbid obesity ([place]) 10/10/2020   Primary hypertension 10/10/2020   Acquired acanthosis nigricans 03/14/2020   Chronic bilateral low back pain without sciatica 11/08/2019   DDD (degenerative disc disease), lumbar 11/08/2019   Facet arthritis of lumbar region 11/08/2019   Hypokalemia 12/16/2016   Influenza B 01/02/2015   Lower extremity pain, bilateral 08/01/2014   Hyperlipidemia 04/14/2013   Essential hypertension 04/14/2013   Vitamin D deficiency 04/14/2013   Migraine without aura 12/21/2012   Sleep disturbance 12/21/2012   Obesity 12/04/2011   Esophageal reflux 08/23/2011    Past Surgical History:  Procedure Laterality Date   ABDOMINAL HYSTERECTOMY     CHOLECYSTECTOMY     TUBAL LIGATION       OB History   No obstetric history on file.     Family History   Problem Relation Age of Onset   Congestive Heart Failure Mother 55   Diabetes Mother    Colon cancer Mother    Coronary artery disease Mother        Triple Bypass at age 46   Healthy Father    Healthy Sister    Healthy Sister    Hypertension Brother    Hypertension Brother    Healthy Brother    Healthy Brother     Social History   Tobacco Use   Smoking status: Never   Smokeless tobacco: Never  Vaping Use   Vaping Use: Never used  Substance Use Topics   Alcohol use: No   Drug use: No    Home Medications Prior to Admission medications   Medication Sig Start Date End Date Taking? Authorizing Provider  potassium chloride SA (KLOR-CON M) 20 MEQ tablet Take 1 tablet (20 mEq total) by mouth daily. 01/11/21  Yes Malvin Johns, MD  atorvastatin (LIPITOR) 40 MG tablet Take 1 tablet by mouth at bedtime. 07/19/20   [provider]  carvedilol (COREG) 12.5 MG tablet Take 1 tablet (12.5 mg total) by mouth 2 (two) times daily. 10/10/20 01/08/21  Tobb, Kardie, DO  hydrochlorothiazide (HYDRODIURIL) 25 MG tablet Take 1 tablet (25 mg total) by mouth daily. 10/10/20 01/08/21  Tobb, Kardie, DO  ibuprofen (ADVIL,MOTRIN) 200 MG tablet Take 200 mg by mouth every 6 (six) hours as needed for headache or mild pain.  [provider]  meloxicam (MOBIC) 15 MG tablet Take 15 mg by mouth daily. 09/19/20   [provider]  methocarbamol (ROBAXIN) 500 MG tablet Take 1 tablet (500 mg total) by mouth 2 (two) times daily. 04/26/19   Tedd Sias, PA  Vitamin D, Ergocalciferol, (DRISDOL) 1.25 MG (50000 UNIT) CAPS capsule Take 50,000 Units by mouth once a week. 07/15/20   [provider]    Allergies    Amlodipine, Aspirin, Olmesartan, Contrast media [iodinated diagnostic agents], Lisinopril, and Iodine  Review of Systems   Review of Systems  Constitutional:  Positive for fatigue. Negative for chills, diaphoresis and fever.  HENT:  Negative for congestion, rhinorrhea and  sneezing.   Eyes: Negative.   Respiratory:  Negative for cough, chest tightness and shortness of breath.   Cardiovascular:  Positive for chest pain and palpitations. Negative for leg swelling.  Gastrointestinal:  Negative for abdominal pain, blood in stool, diarrhea, nausea and vomiting.  Genitourinary:  Positive for frequency. Negative for difficulty urinating, flank pain and hematuria.  Musculoskeletal:  Negative for arthralgias and back pain.  Skin:  Negative for rash.  Neurological:  Negative for dizziness, speech difficulty, weakness, numbness and headaches.   Physical Exam Updated Vital Signs BP 131/86 (BP Location: Left Arm)   Pulse 73   Temp 98.6 F (37 C) (Oral)   Resp 18   Ht 4\' 11"  (1.499 m)   Wt 110.2 kg   SpO2 97%   BMI 49.08 kg/m   Physical Exam Constitutional:      Appearance: She is well-developed.  HENT:     Head: Normocephalic and atraumatic.  Eyes:     Pupils: Pupils are equal, round, and reactive to light.  Cardiovascular:     Rate and Rhythm: Normal rate and regular rhythm.     Heart sounds: Normal heart sounds.  Pulmonary:     Effort: Pulmonary effort is normal. No respiratory distress.     Breath sounds: Normal breath sounds. No wheezing or rales.  Chest:     Chest wall: No tenderness.  Abdominal:     General: Bowel sounds are normal.     Palpations: Abdomen is soft.     Tenderness: There is no abdominal tenderness. There is no guarding or rebound.  Musculoskeletal:        General: Normal range of motion.     Cervical back: Normal range of motion and neck supple.     Comments: No edema or calf tenderness  Lymphadenopathy:     Cervical: No cervical adenopathy.  Skin:    General: Skin is warm and dry.     Findings: No rash.  Neurological:     Mental Status: She is alert and oriented to person, place, and time.    ED Results / Procedures / Treatments   Labs (all labs ordered are listed, but only abnormal results are displayed) Labs  Reviewed  CBC - Abnormal; Notable for the following components:      Result Value   Hemoglobin 11.9 (*)    HCT 35.7 (*)    All other components within normal limits  URINALYSIS, ROUTINE W REFLEX MICROSCOPIC - Abnormal; Notable for the following components:   APPearance HAZY (*)    All other components within normal limits  BASIC METABOLIC PANEL - Abnormal; Notable for the following components:   Potassium 2.9 (*)    All other components within normal limits  RESP PANEL BY RT-PCR (FLU A&B, COVID) ARPGX2  TROPONIN I (HIGH  SENSITIVITY)  TROPONIN I (HIGH SENSITIVITY)    EKG EKG Interpretation  Date/Time:  Thursday January 11 2021 19:34:06 EST Ventricular Rate:  85 PR Interval:  132 QRS Duration: 80 QT Interval:  382 QTC Calculation: 454 R Axis:   24 Text Interpretation: Normal sinus rhythm Nonspecific ST abnormality Abnormal ECG since last tracing no significant change Confirmed by Rolan Bucco [phone]) on 01/11/2021 9:50:04 PM  Radiology DG Chest 2 View  Result Date: 01/11/2021 CLINICAL DATA:  Chest pain and shortness of breath. EXAM: CHEST - 2 VIEW COMPARISON:  06/07/2020 FINDINGS: Mild linear scarring in the left lower lobe. The lungs appear otherwise clear. Cardiac and mediastinal margins appear normal. No blunting of the costophrenic angles. IMPRESSION: 1.  No active cardiopulmonary disease is radiographically apparent. 2. Mild linear scarring in the left lower lobe is unchanged. Electronically Signed   By: Gaylyn Rong M.D.   On: 01/11/2021 19:56    Procedures Procedures   Medications Ordered in ED Medications  potassium chloride SA (KLOR-CON M) CR tablet 40 mEq (40 mEq Oral Given 01/11/21 2233)    ED Course  I have reviewed the triage vital signs and the nursing notes.  Pertinent labs & imaging results that were available during my care of the patient were reviewed by me and considered in my medical decision making (see chart for details).    MDM  Rules/Calculators/A&P                           Patient presents to the ED because her potassium was noted to be low by her primary care doctor.  It was 2.9.  She was given a dose of potassium in the ED and I will prescribe her 3 days worth of potassium.  She also complains of fatigue and some intermittent chest pains.  She describes them as sharp and nonsustained.  She also has some intermittent palpitations.  Her labs are nonconcerning.  Her urine does not appear concerning for infection.  Her COVID/flu test is pending.  She will follow this up on MyChart rather than waiting for the result.  On chart review, she recently has seen cardiology and had a monitor placed.  This was due to palpitations.  The monitor showed 1 episode of 5 beat nonsustained V. tach.  No other arrhythmias.  She is asymptomatic with the palpitations other than some intermittent chest pains.  Her troponins are negative.  Her EKG does not show any ischemic changes.  No arrhythmias.  I advised her to follow-up with her cardiologist regarding this. Final Clinical Impression(s) / ED Diagnoses Final diagnoses:  Hypokalemia  Atypical chest pain  Other fatigue    Rx / DC Orders ED Discharge Orders          Ordered    potassium chloride SA (KLOR-CON M) 20 MEQ tablet  Daily        01/11/21 2305             Rolan Bucco, MD 01/11/21 2308

## 2021-01-11 NOTE — ED Notes (Signed)
Unsuccessful IV attempt. Was able to get blood however was coming out of vein slowly.

## 2021-01-11 NOTE — ED Triage Notes (Signed)
Pt was told to go to ER for potassium of 2.9, had labwork drawn today. C/o chest pain, weakness, SOB.

## 2021-04-12 ENCOUNTER — Ambulatory Visit: Payer: BC Managed Care – PPO | Admitting: Cardiology

## 2021-04-23 ENCOUNTER — Encounter: Payer: Self-pay | Admitting: Cardiology

## 2021-04-23 ENCOUNTER — Ambulatory Visit: Payer: BC Managed Care – PPO | Admitting: Cardiology

## 2021-04-23 ENCOUNTER — Other Ambulatory Visit: Payer: Self-pay

## 2021-04-23 DIAGNOSIS — E785 Hyperlipidemia, unspecified: Secondary | ICD-10-CM | POA: Diagnosis not present

## 2021-04-23 DIAGNOSIS — R7303 Prediabetes: Secondary | ICD-10-CM | POA: Diagnosis not present

## 2021-04-23 DIAGNOSIS — E782 Mixed hyperlipidemia: Secondary | ICD-10-CM

## 2021-04-23 MED ORDER — CARVEDILOL 6.25 MG PO TABS: 6.2500 mg | ORAL_TABLET | Freq: Two times a day (BID) | ORAL | 3 refills | Status: DC

## 2021-04-23 MED ORDER — ATORVASTATIN CALCIUM 40 MG PO TABS: 40.0000 mg | ORAL_TABLET | Freq: Every day | ORAL | 3 refills | Status: DC

## 2021-04-23 NOTE — Progress Notes (Signed)
?Cardiology Office Note:   ? ?Date:  04/23/2021  ? ?ID:  Brooke FinlandMonica Trujillo, DOB 11/27/1970, MRN [id number] ? ?PCP:  Adolph PollackSmith, Natalie Reid, FNP  ?Cardiologist:  Thomasene RippleKardie Dawanna Grauberger, DO  ?Electrophysiologist:  None  ? ?Referring MD: Adolph PollackSmith, Natalie Reid, FNP  ? ?" I am doing fine" ? ?History of Present Illness:   ? ?Brooke Trujillo is a 51 y.o. female with a hx of prediabetes, hypertension, hyperlipidemia, morbid obesity, and SVT which was seen on ZIO monitor is here today for follow-up visit. ? ?At her last visit on October 10, 2020 at that time we increased her carvedilol to 12.5 mg twice daily due to the NSVT and also to help control her blood pressure.  She maintain her hydrochlorothiazide. ? ?She tells me that since I last saw her she was [facility] to [facility] after she had a syncope episode.  She notes that she was coughing and went through a coughing spell and eventually passed out.  She had a thorough work-up over at the hospital and everything was found to be normal. ? ?Her blood pressure medications were held, she also had not been taking her Lipitor. ? ?No other complaints at this time. ? ?Past Medical History:  ?Diagnosis Date  ? High cholesterol   ? Hypertension   ? ? ?Past Surgical History:  ?Procedure Laterality Date  ? ABDOMINAL HYSTERECTOMY    ? CHOLECYSTECTOMY    ? TUBAL LIGATION    ? ? ?Current Medications: ?Current Meds  ?Medication Sig  ? carvedilol (COREG) 6.25 MG tablet Take 1 tablet (6.25 mg total) by mouth 2 (two) times daily.  ? ibuprofen (ADVIL,MOTRIN) 200 MG tablet Take 200 mg by mouth every 6 (six) hours as needed for headache or mild pain.  ? meloxicam (MOBIC) 15 MG tablet Take 15 mg by mouth daily.  ? methocarbamol (ROBAXIN) 500 MG tablet Take 1 tablet (500 mg total) by mouth 2 (two) times daily.  ? potassium chloride SA (KLOR-CON M) 20 MEQ tablet Take 1 tablet (20 mEq total) by mouth daily.  ? Semaglutide, 1 MG/DOSE, (OZEMPIC, 1 MG/DOSE,) 4 MG/3ML SOPN   ? Vitamin D, Ergocalciferol,  (DRISDOL) 1.25 MG (50000 UNIT) CAPS capsule Take 50,000 Units by mouth once a week.  ? [DISCONTINUED] atorvastatin (LIPITOR) 40 MG tablet Take 1 tablet by mouth at bedtime.  ?  ? ?Allergies:   Amlodipine, Aspirin, Olmesartan, Contrast media [iodinated contrast media], Lisinopril, and Iodine  ? ?Social History  ? ?Socioeconomic History  ? Marital status: Single  ?  Spouse name: Not on file  ? Number of children: Not on file  ? Years of education: Not on file  ? Highest education level: Not on file  ?Occupational History  ? Not on file  ?Tobacco Use  ? Smoking status: Never  ? Smokeless tobacco: Never  ?Vaping Use  ? Vaping Use: Never used  ?Substance and Sexual Activity  ? Alcohol use: No  ? Drug use: No  ? Sexual activity: Never  ?  Birth control/protection: Surgical  ?Other Topics Concern  ? Not on file  ?Social History Narrative  ? Not on file  ? ?Social Determinants of Health  ? ?Financial Resource Strain: Not on file  ?Food Insecurity: Not on file  ?Transportation Needs: Not on file  ?Physical Activity: Not on file  ?Stress: Not on file  ?Social Connections: Not on file  ?  ? ?Family History: ?The patient's family history includes Colon cancer in her mother; Congestive Heart Failure (age  of onset: 62) in her mother; Coronary artery disease in her mother; Diabetes in her mother; Healthy in her brother, brother, father, sister, and sister; Hypertension in her brother and brother. ? ?ROS:   ?Review of Systems  ?Constitution: Negative for decreased appetite, fever and weight gain.  ?HENT: Negative for congestion, ear discharge, hoarse voice and sore throat.   ?Eyes: Negative for discharge, redness, vision loss in right eye and visual halos.  ?Cardiovascular: Negative for chest pain, dyspnea on exertion, leg swelling, orthopnea and palpitations.  ?Respiratory: Negative for cough, hemoptysis, shortness of breath and snoring.   ?Endocrine: Negative for heat intolerance and polyphagia.  ?Hematologic/Lymphatic: Negative  for bleeding problem. Does not bruise/bleed easily.  ?Skin: Negative for flushing, nail changes, rash and suspicious lesions.  ?Musculoskeletal: Negative for arthritis, joint pain, muscle cramps, myalgias, neck pain and stiffness.  ?Gastrointestinal: Negative for abdominal pain, bowel incontinence, diarrhea and excessive appetite.  ?Genitourinary: Negative for decreased libido, genital sores and incomplete emptying.  ?Neurological: Negative for brief paralysis, focal weakness, headaches and loss of balance.  ?Psychiatric/Behavioral: Negative for altered mental status, depression and suicidal ideas.  ?Allergic/Immunologic: Negative for HIV exposure and persistent infections.  ? ? ?EKGs/Labs/Other Studies Reviewed:   ? ?The following studies were reviewed today: ? ? ?EKG: None today ? ?Echocardiogram done on February 05, 2021 and [facility] ?PROCEDURE  ?A complete two-dimensional transthoracic echocardiogram was performed  ?(2D, M-mode, spectral and color flow Doppler). Image Quality:  ?Technically adequate.  ?-  ?SUMMARY  ?Left ventricular systolic function is normal.  ?LV ejection fraction = 60-65%.  ?There is mild tricuspid regurgitation.  ?There is no comparison study available.  ? ?-  ?FINDINGS:  ?LEFT VENTRICLE  ?The left ventricular size is normal. There is normal left ventricular  ?wall thickness. Left ventricular systolic function is normal. LV  ?ejection fraction = 60-65%. Normal left ventricular diastolic function  ?and left atrial pressure. The left ventricular wall motion is normal.  ? ?-  ?RIGHT VENTRICLE  ?The right ventricle is normal in size and function.  ? ?LEFT ATRIUM  ?The left atrial size is normal.  ? ?RIGHT ATRIUM  ?Right atrial size is normal.  ?-  ?AORTIC VALVE  ?The aortic valve is trileaflet. The aortic valve is normal in  ?structure and function. There is no aortic stenosis. There is no  ?aortic regurgitation.  ?-  ?MITRAL VALVE  ?The mitral valve  leaflets appear normal. There is trace mitral  ?regurgitation.  ?-  ?TRICUSPID VALVE  ?Structurally normal tricuspid valve. There is mild tricuspid  ?regurgitation. No pulmonary hypertension. Estimated right ventricular  ?systolic pressure is 37 mmHg.  ?-  ?PULMONIC VALVE  ?The pulmonic valve is not well visualized. Mild pulmonic valvular  ?regurgitation.  ?-  ?ARTERIES  ?The aortic sinus is normal size.  ?-  ?VENOUS  ?Pulmonary venous flow pattern is normal. IVC size was normal.  ?-  ?EFFUSION  ?There is no pericardial effusion.  ?-  ? ?CTA chest February 05, 2021 ?IMPRESSION:  ?1. No evidence of a pulmonary embolism.  ?2. No acute findings.   ? ?Transthoracic echocardiogram July 27, 2020 ?IMPRESSIONS  ? ? ? 1. Left ventricular ejection fraction, by estimation, is 60 to 65%. The  ?left ventricle has normal function. The left ventricle has no regional  ?wall motion abnormalities. Left ventricular diastolic parameters were  ?normal.  ? 2. Right ventricular systolic function is normal. The right ventricular  ?size is normal.  ? 3. The  mitral valve is normal in structure. No evidence of mitral valve  ?regurgitation. No evidence of mitral stenosis.  ? 4. The aortic valve is tricuspid. Aortic valve regurgitation is not  ?visualized. No aortic stenosis is present.  ? 5. The inferior vena cava is normal in size with greater than 50%  ?respiratory variability, suggesting right atrial pressure of 3 mmHg.  ? ?Comparison(s): No prior Echocardiogram.  ? ?Conclusion(s)/Recommendation(s): Normal biventricular function without  ?evidence of hemodynamically significant valvular heart disease.  ? ?FINDINGS  ? Left Ventricle: Left ventricular ejection fraction, by estimation, is 60  ?to 65%. The left ventricle has normal function. The left ventricle has no  ?regional wall motion abnormalities. The left ventricular internal cavity  ?size was normal in size. There is  ? no left ventricular hypertrophy. Left ventricular diastolic  parameters  ?were normal.  ? ?Right Ventricle: The right ventricular size is normal. No increase in  ?right ventricular wall thickness. Right ventricular systolic function is  ?normal.  ? ?Left Atrium: Left atrial size wa

## 2021-04-23 NOTE — Patient Instructions (Addendum)
Medication Instructions:  ?Your physician has recommended you make the following change in your medication:  ?STOP: Hydrochlorothiazide ?DECREASE: Coreg 6.25 mg twice daily ? ?*If you need a refill on your cardiac medications before your next appointment, please call your pharmacy* ? ? ?Lab Work: ?None ?If you have labs (blood work) drawn today and your tests are completely normal, you will receive your results only by: ?MyChart Message (if you have MyChart) OR ?A paper copy in the mail ?If you have any lab test that is abnormal or we need to change your treatment, we will call you to review the results. ? ? ?Testing/Procedures: ?None ? ? ?Follow-Up: ?At [facility], you and your health needs are our priority.  As part of our continuing mission to provide you with exceptional heart care, we have created designated Provider Care Teams.  These Care Teams include your primary Cardiologist (physician) and Advanced Practice Providers (APPs -  Physician Assistants and Nurse Practitioners) who all work together to provide you with the care you need, when you need it. ? ?We recommend signing up for the patient portal called "MyChart".  Sign up information is provided on this After Visit Summary.  MyChart is used to connect with patients for Virtual Visits (Telemedicine).  Patients are able to view lab/test results, encounter notes, upcoming appointments, etc.  Non-urgent messages can be sent to your provider as well.   ?To learn more about what you can do with MyChart, go to [email or web address].   ? ?Your next appointment:   ?6 month(s) ? ?The format for your next appointment:   ?In Person ? ?Provider:   ?Berniece Salines, DO   ? ? ?Other Instructions ?  ?

## 2021-08-23 IMAGING — CT CT ANGIO CHEST
2 of 6 series · 19 of 36 positions shown · IV contrast (omnipaque)
Comparison: 03/29/2018 CT chest, and chest radiograph from
06/07/2020

CLINICAL DATA: Elevated D-dimer level. Nausea and shortness of
breath. Arm pain.

EXAM:
CT ANGIOGRAPHY CHEST WITH CONTRAST
TECHNIQUE: Multidetector CT imaging of the chest was performed using the
standard protocol during bolus administration of intravenous
contrast. Multiplanar CT image reconstructions and MIPs were
obtained to evaluate the vascular anatomy.
CONTRAST:  60mL OMNIPAQUE IOHEXOL 350 MG/ML SOLN

[Series 7: pe thins · axial · 0.93mm/px · z∈[+1357,+1603]mm · 18 of 391 slices shown]
[im 20/391  lung]
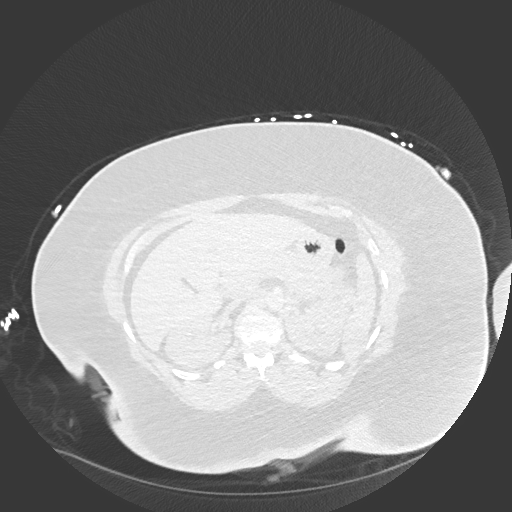
[im 40/391  mediastinal]
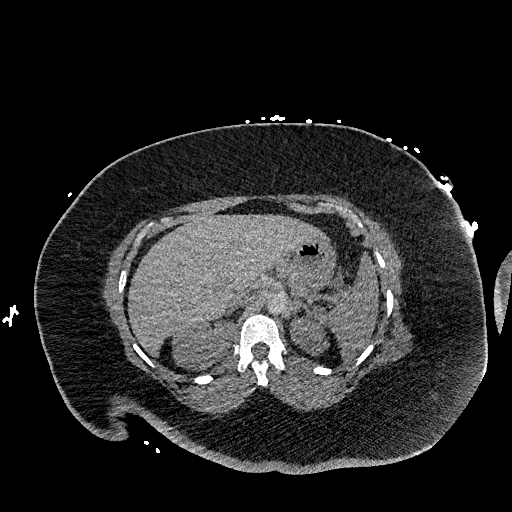
[im 59/391  lung]
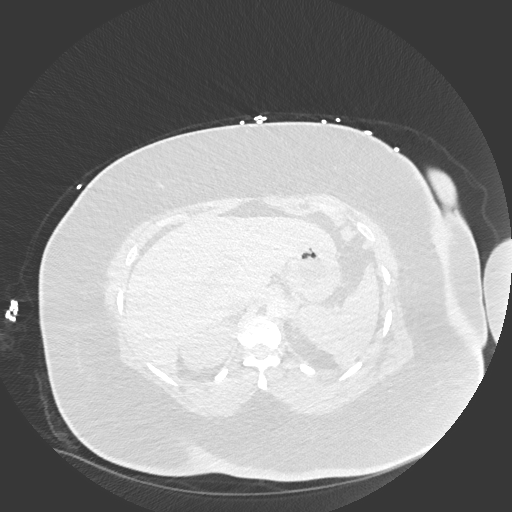
[im 79/391  mediastinal]
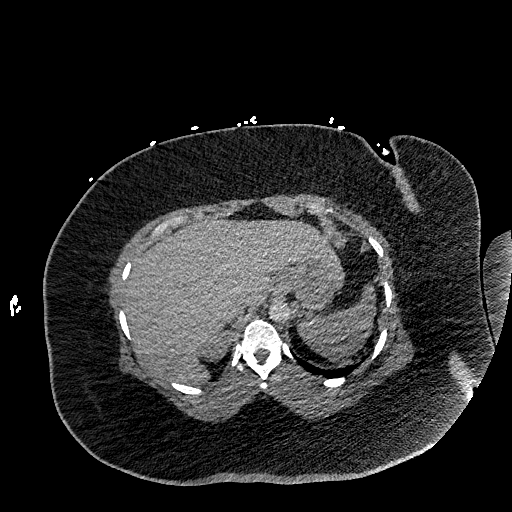
[im 98/391  lung]
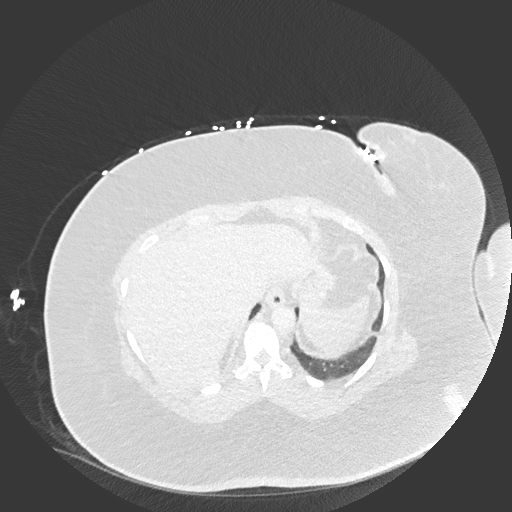
[im 118/391  mediastinal]
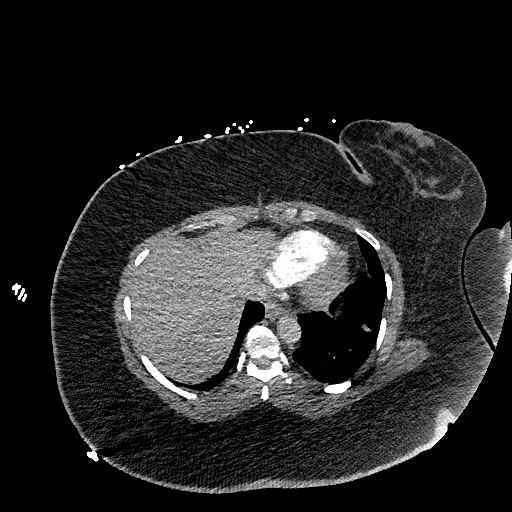
[im 137/391  lung]
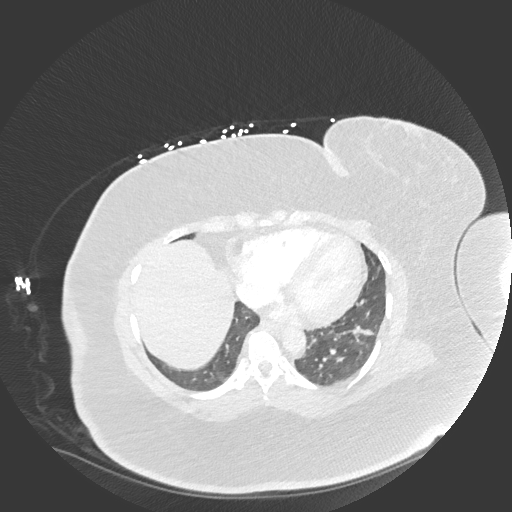
[im 157/391  mediastinal]
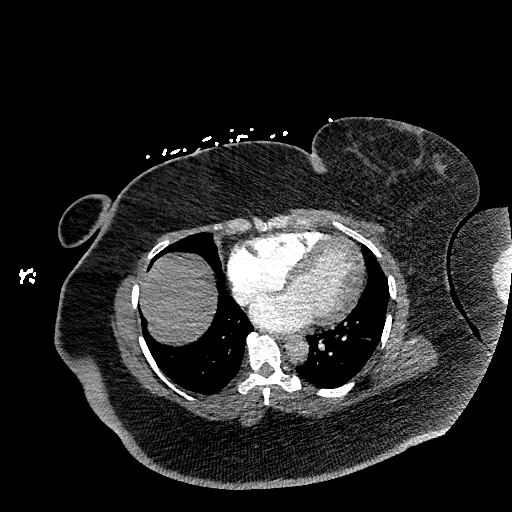
[im 176/391  lung]
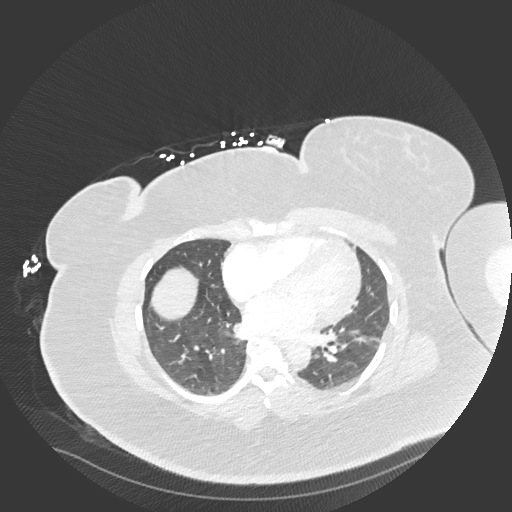
[im 215/391  mediastinal]
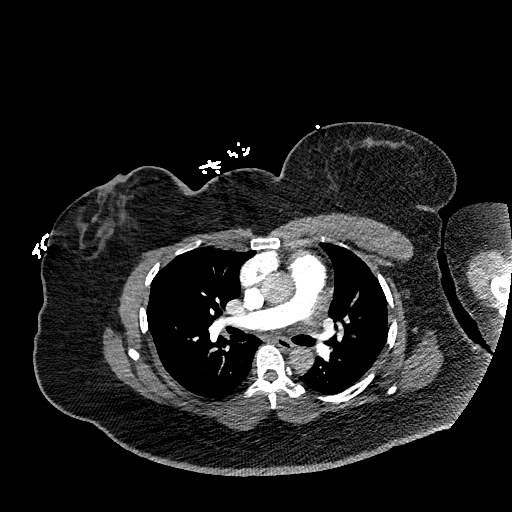
[im 235/391  lung]
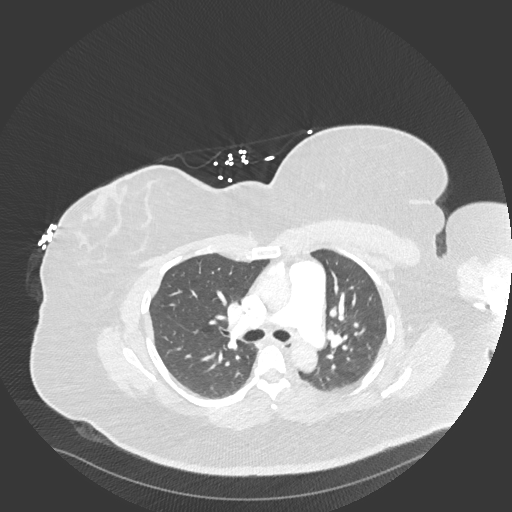
[im 254/391  mediastinal]
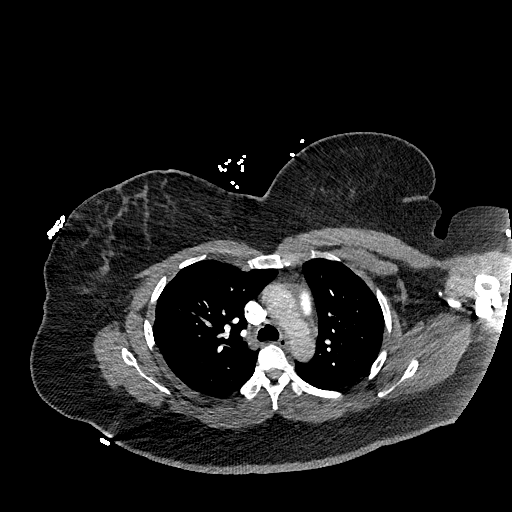
[im 274/391  lung]
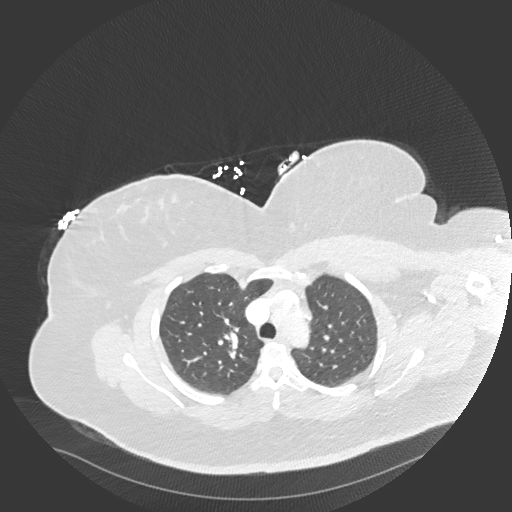
[im 293/391  mediastinal]
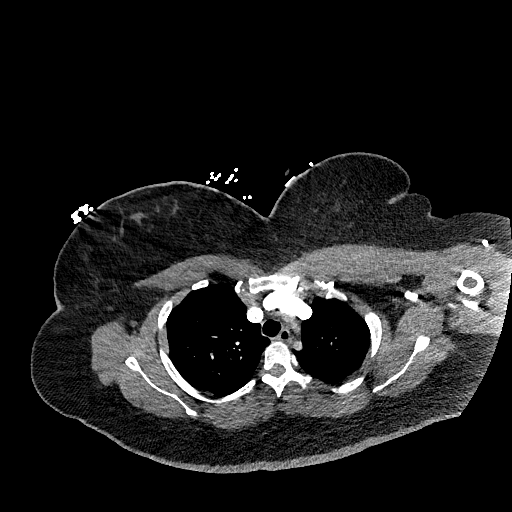
[im 313/391  lung]
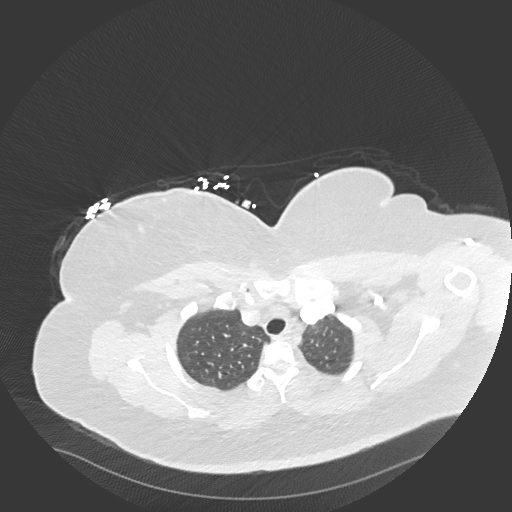
[im 332/391  mediastinal]
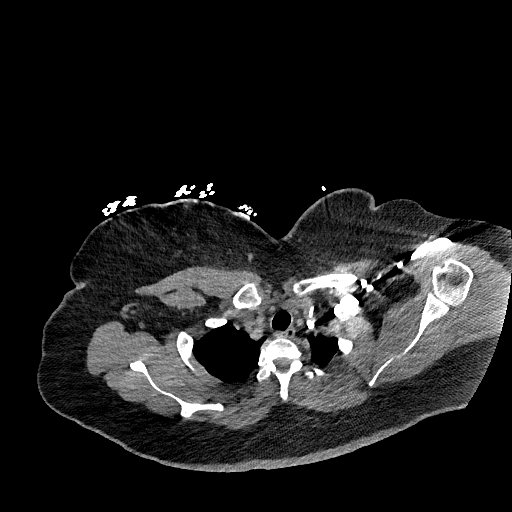
[im 352/391  lung]
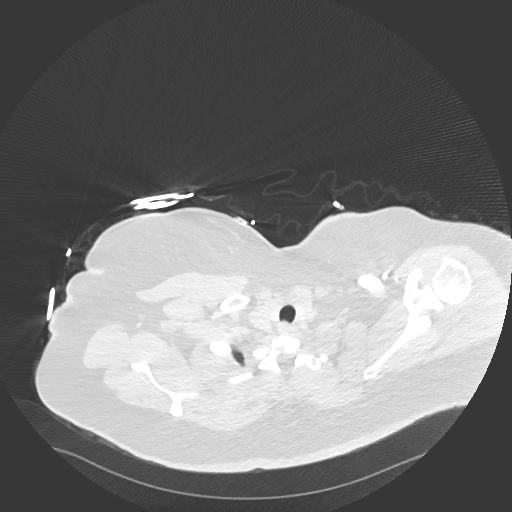
[im 371/391  mediastinal]
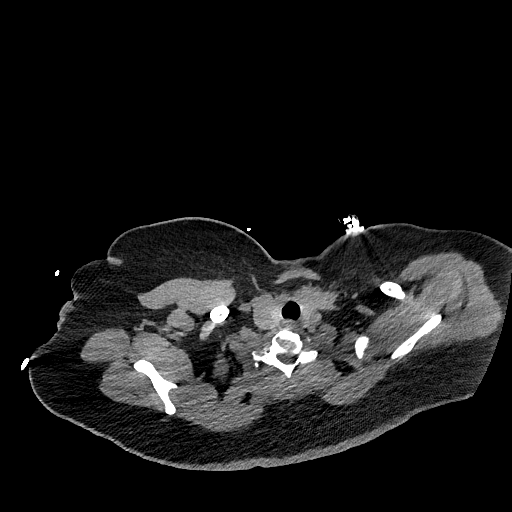

[Series 8: pe 2mm cor · coronal · 0.59mm/px · 1 of 151 slices shown]
[im 76/151  mediastinal]
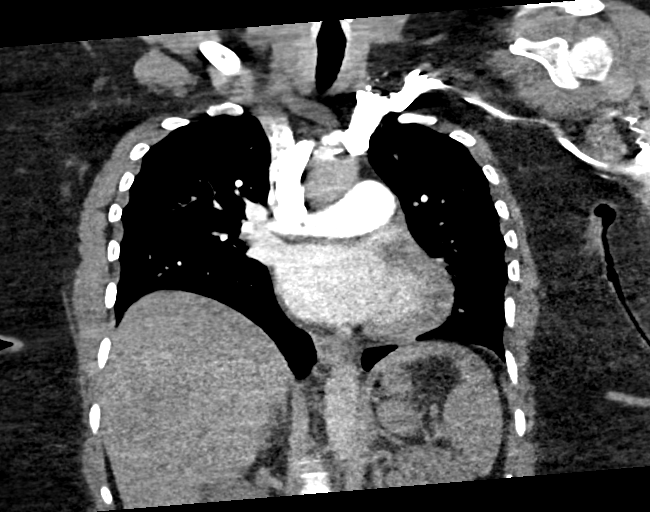

[19 of 36 positions shown; findings below may reference images not displayed]

FINDINGS: Cardiovascular: No filling defect is identified in the pulmonary
arterial tree to suggest pulmonary embolus. No acute vascular
findings are identified.

Mediastinum/Nodes: Unremarkable

Lungs/Pleura: Stable bandlike scarring in the left lower lobe. The
previous ground-glass density nodule shown on 03/29/2018 has
resolved.

Upper Abdomen: Cholecystectomy.

Musculoskeletal: Unremarkable

Review of the MIP images confirms the above findings.
IMPRESSION: 1. No pulmonary embolus or acute thoracic findings identified.
2. Stable scarring in the left lower lobe.

## 2021-10-23 ENCOUNTER — Encounter: Payer: Self-pay | Admitting: Cardiology

## 2021-10-23 ENCOUNTER — Ambulatory Visit: Payer: BC Managed Care – PPO | Attending: Cardiology | Admitting: Cardiology

## 2021-10-23 VITALS — BP 152/110 | HR 78 | Ht 59.0 in | Wt 230.4 lb

## 2021-10-23 DIAGNOSIS — I10 Essential (primary) hypertension: Secondary | ICD-10-CM

## 2021-10-23 DIAGNOSIS — R7303 Prediabetes: Secondary | ICD-10-CM

## 2021-10-23 MED ORDER — CARVEDILOL 12.5 MG PO TABS: 12.5000 mg | ORAL_TABLET | Freq: Two times a day (BID) | ORAL | 3 refills | Status: DC

## 2021-10-23 NOTE — Patient Instructions (Addendum)
Medication Instructions:  Your physician has recommended you make the following change in your medication:  INCREASE: Coreg 12.5 mg twice daily  Please take your blood pressure daily for 2 weeks and send in a MyChart message. Please include heart rates.   HOW TO TAKE YOUR BLOOD PRESSURE: Rest 5 minutes before taking your blood pressure. Don't smoke or drink caffeinated beverages for at least 30 minutes before. Take your blood pressure before (not after) you eat. Sit comfortably with your back supported and both feet on the floor (don't cross your legs). Elevate your arm to heart level on a table or a desk. Use the proper sized cuff. It should fit smoothly and snugly around your bare upper arm. There should be enough room to slip a fingertip under the cuff. The bottom edge of the cuff should be 1 inch above the crease of the elbow. Ideally, take 3 measurements at one sitting and record the average. *If you need a refill on your cardiac medications before your next appointment, please call your pharmacy*   Lab Work: None  Testing/Procedures: None   Follow-Up: At [facility], you and your health needs are our priority.  As part of our continuing mission to provide you with exceptional heart care, we have created designated Provider Care Teams.  These Care Teams include your primary Cardiologist (physician) and Advanced Practice Providers (APPs -  Physician Assistants and Nurse Practitioners) who all work together to provide you with the care you need, when you need it.  We recommend signing up for the patient portal called "MyChart".  Sign up information is provided on this After Visit Summary.  MyChart is used to connect with patients for Virtual Visits (Telemedicine).  Patients are able to view lab/test results, encounter notes, upcoming appointments, etc.  Non-urgent messages can be sent to your provider as well.   To learn more about what you can do with MyChart, go to  [email or web address].    Your next appointment:   12 week(s)  The format for your next appointment:   In Person  Provider:   Berniece Salines, DO     Other Instructions   Important Information About Sugar

## 2021-10-24 NOTE — Progress Notes (Signed)
Cardiology Office Note:    Date:  10/24/2021   ID:  Brooke Trujillo, DOB Apr 19, 1970, MRN [id number]  PCP:  Adolph Pollack, FNP  Cardiologist:  Thomasene Ripple, DO  Electrophysiologist:  None   Referring MD: Adolph Pollack, FNP   " I am doing fine"  History of Present Illness:    Brooke Trujillo is a 51 y.o. female with a hx of prediabetes, hypertension, hyperlipidemia, morbid obesity, and SVT which was seen on ZIO monitor is here today for follow-up visit.  At her  visit on October 10, 2020 at that time we increased her carvedilol to 12.5 mg twice daily due to the NSVT and also to help control her blood pressure.  She maintain her hydrochlorothiazide.  I saw her on April 23, 2021 she had been hospitalized at [facility] at that time she had syncope episode due to low blood pressure.  Her blood pressure medications were held, she also had not been taking her Lipitor.  During the visit we stopped the hydrochlorothiazide and cut back on her carvedilol as her blood pressure was very low. Today she tells me she is experiencing headaches.  Past Medical History:  Diagnosis Date   High cholesterol    Hypertension     Past Surgical History:  Procedure Laterality Date   ABDOMINAL HYSTERECTOMY     CHOLECYSTECTOMY     TUBAL LIGATION      Current Medications: Current Meds  Medication Sig   atorvastatin (LIPITOR) 20 MG tablet Take 20 mg by mouth daily.   carvedilol (COREG) 12.5 MG tablet Take 1 tablet (12.5 mg total) by mouth 2 (two) times daily.   hydrochlorothiazide (HYDRODIURIL) 25 MG tablet Take 25 mg by mouth daily.   ibuprofen (ADVIL,MOTRIN) 200 MG tablet Take 200 mg by mouth every 6 (six) hours as needed for headache or mild pain.   meloxicam (MOBIC) 15 MG tablet Take 15 mg by mouth daily.   methocarbamol (ROBAXIN) 500 MG tablet Take 1 tablet (500 mg total) by mouth 2 (two) times daily.   montelukast (SINGULAIR) 10 MG tablet Take by mouth.   naproxen (EC  NAPROSYN) 500 MG EC tablet Take 500 mg by mouth 2 (two) times daily.   potassium chloride SA (KLOR-CON M) 20 MEQ tablet Take 1 tablet (20 mEq total) by mouth daily.   SAXENDA 18 MG/3ML SOPN Inject into the skin.   Semaglutide, 1 MG/DOSE, (OZEMPIC, 1 MG/DOSE,) 4 MG/3ML SOPN    SUMAtriptan (IMITREX) 50 MG tablet 1 tablet (50 mg total).   tiZANidine (ZANAFLEX) 4 MG tablet Take by mouth.   Vitamin D, Ergocalciferol, (DRISDOL) 1.25 MG (50000 UNIT) CAPS capsule Take 50,000 Units by mouth once a week.   [DISCONTINUED] carvedilol (COREG) 6.25 MG tablet Take 1 tablet (6.25 mg total) by mouth 2 (two) times daily.     Allergies:   Amlodipine, Aspirin, Olmesartan, Contrast media [iodinated contrast media], Lisinopril, and Iodine   Social History   Socioeconomic History   Marital status: Single    Spouse name: Not on file   Number of children: Not on file   Years of education: Not on file   Highest education level: Not on file  Occupational History   Not on file  Tobacco Use   Smoking status: Never   Smokeless tobacco: Never  Vaping Use   Vaping Use: Never used  Substance and Sexual Activity   Alcohol use: No   Drug use: No   Sexual activity: Never  Birth control/protection: Surgical  Other Topics Concern   Not on file  Social History Narrative   Not on file   Social Determinants of Health   Financial Resource Strain: Not on file  Food Insecurity: Not on file  Transportation Needs: Not on file  Physical Activity: Not on file  Stress: Not on file  Social Connections: Not on file     Family History: The patient's family history includes Colon cancer in her mother; Congestive Heart Failure (age of onset: 75) in her mother; Coronary artery disease in her mother; Diabetes in her mother; Healthy in her brother, brother, father, sister, and sister; Hypertension in her brother and brother.  ROS:   Review of Systems  Constitution: Negative for decreased appetite, fever and weight  gain.  HENT: Negative for congestion, ear discharge, hoarse voice and sore throat.   Eyes: Negative for discharge, redness, vision loss in right eye and visual halos.  Cardiovascular: Negative for chest pain, dyspnea on exertion, leg swelling, orthopnea and palpitations.  Respiratory: Negative for cough, hemoptysis, shortness of breath and snoring.   Endocrine: Negative for heat intolerance and polyphagia.  Hematologic/Lymphatic: Negative for bleeding problem. Does not bruise/bleed easily.  Skin: Negative for flushing, nail changes, rash and suspicious lesions.  Musculoskeletal: Negative for arthritis, joint pain, muscle cramps, myalgias, neck pain and stiffness.  Gastrointestinal: Negative for abdominal pain, bowel incontinence, diarrhea and excessive appetite.  Genitourinary: Negative for decreased libido, genital sores and incomplete emptying.  Neurological: Negative for brief paralysis, focal weakness, headaches and loss of balance.  Psychiatric/Behavioral: Negative for altered mental status, depression and suicidal ideas.  Allergic/Immunologic: Negative for HIV exposure and persistent infections.    EKGs/Labs/Other Studies Reviewed:    The following studies were reviewed today:   EKG: None today  Echocardiogram done on February 05, 2021 and [facility] PROCEDURE  A complete two-dimensional transthoracic echocardiogram was performed  (2D, M-mode, spectral and color flow Doppler). Image Quality:  Technically adequate.  -  SUMMARY  Left ventricular systolic function is normal.  LV ejection fraction = 60-65%.  There is mild tricuspid regurgitation.  There is no comparison study available.   -  FINDINGS:  LEFT VENTRICLE  The left ventricular size is normal. There is normal left ventricular  wall thickness. Left ventricular systolic function is normal. LV  ejection fraction = 60-65%. Normal left ventricular diastolic function  and left atrial  pressure. The left ventricular wall motion is normal.   -  RIGHT VENTRICLE  The right ventricle is normal in size and function.   LEFT ATRIUM  The left atrial size is normal.   RIGHT ATRIUM  Right atrial size is normal.  -  AORTIC VALVE  The aortic valve is trileaflet. The aortic valve is normal in  structure and function. There is no aortic stenosis. There is no  aortic regurgitation.  -  MITRAL VALVE  The mitral valve leaflets appear normal. There is trace mitral  regurgitation.  -  TRICUSPID VALVE  Structurally normal tricuspid valve. There is mild tricuspid  regurgitation. No pulmonary hypertension. Estimated right ventricular  systolic pressure is 37 mmHg.  -  PULMONIC VALVE  The pulmonic valve is not well visualized. Mild pulmonic valvular  regurgitation.  -  ARTERIES  The aortic sinus is normal size.  -  VENOUS  Pulmonary venous flow pattern is normal. IVC size was normal.  -  EFFUSION  There is no pericardial effusion.  -   CTA chest February 05, 2021 IMPRESSION:  1. No evidence of a pulmonary embolism.  2. No acute findings.    Transthoracic echocardiogram July 27, 2020 IMPRESSIONS     1. Left ventricular ejection fraction, by estimation, is 60 to 65%. The  left ventricle has normal function. The left ventricle has no regional  wall motion abnormalities. Left ventricular diastolic parameters were  normal.   2. Right ventricular systolic function is normal. The right ventricular  size is normal.   3. The mitral valve is normal in structure. No evidence of mitral valve  regurgitation. No evidence of mitral stenosis.   4. The aortic valve is tricuspid. Aortic valve regurgitation is not  visualized. No aortic stenosis is present.   5. The inferior vena cava is normal in size with greater than 50%  respiratory variability, suggesting right atrial pressure of 3 mmHg.   Comparison(s): No prior Echocardiogram.   Conclusion(s)/Recommendation(s): Normal  biventricular function without  evidence of hemodynamically significant valvular heart disease.   FINDINGS   Left Ventricle: Left ventricular ejection fraction, by estimation, is 60  to 65%. The left ventricle has normal function. The left ventricle has no  regional wall motion abnormalities. The left ventricular internal cavity  size was normal in size. There is   no left ventricular hypertrophy. Left ventricular diastolic parameters  were normal.   Right Ventricle: The right ventricular size is normal. No increase in  right ventricular wall thickness. Right ventricular systolic function is  normal.   Left Atrium: Left atrial size was normal in size.   Right Atrium: Right atrial size was normal in size.   Pericardium: There is no evidence of pericardial effusion.   Mitral Valve: The mitral valve is normal in structure. No evidence of  mitral valve regurgitation. No evidence of mitral valve stenosis.   Tricuspid Valve: The tricuspid valve is normal in structure. Tricuspid  valve regurgitation is trivial. No evidence of tricuspid stenosis.   Aortic Valve: The aortic valve is tricuspid. Aortic valve regurgitation is  not visualized. No aortic stenosis is present.   Pulmonic Valve: The pulmonic valve was not well visualized. Pulmonic valve  regurgitation is trivial. No evidence of pulmonic stenosis.   Aorta: The aortic root, ascending aorta and aortic arch are all  structurally normal, with no evidence of dilitation or obstruction.   Venous: The inferior vena cava is normal in size with greater than 50%  respiratory variability, suggesting right atrial pressure of 3 mmHg.   IAS/Shunts: The atrial septum is grossly normal.        Zio monitor July 24, 2020 Patch Wear Time:  7 days and 5 hours starting July 10, 2020. Indications: Palpitations   Patient had a minimal HR of 53 bpm, maximum HR of 141 bpm, and average HR of 82 bpm.   Predominant underlying rhythm was Sinus  Rhythm.   1 run of Ventricular Tachycardia occurred lasting 5 beats with a max rate of 141 bpm (average 121 bpm).   Premature atrial complexes were rare. Premature ventricular complexes were rare.   Symptoms associated with premature ventricular complexes.   No pauses, no supraventricular tachycardia, no atrial fibrillation.   Conclusion: This study is remarkable for 5 beat run of nonsustained ventricular tachycardia.    Recent Labs: 01/11/2021: BUN 11; Creatinine, Ser 0.85; Hemoglobin 11.9; Platelets 266; Potassium 2.9; Sodium 138  Recent Lipid Panel No results found for: "CHOL", "TRIG", "HDL", "CHOLHDL", "VLDL", "LDLCALC", "LDLDIRECT"  Physical Exam:    VS:  BP (!) 152/110  Pulse 78   Ht 4\' 11"  (1.499 m)   Wt 230 lb 6.4 oz (104.5 kg)   SpO2 95%   BMI 46.54 kg/m     Wt Readings from Last 3 Encounters:  10/23/21 230 lb 6.4 oz (104.5 kg)  04/23/21 235 lb 9.6 oz (106.9 kg)  01/11/21 243 lb (110.2 kg)     GEN: Well nourished, well developed in no acute distress HEENT: Normal NECK: No JVD; No carotid bruits LYMPHATICS: No lymphadenopathy CARDIAC: S1S2 noted,RRR, no murmurs, rubs, gallops RESPIRATORY:  Clear to auscultation without rales, wheezing or rhonchi  ABDOMEN: Soft, non-tender, non-distended, +bowel sounds, no guarding. EXTREMITIES: No edema, No cyanosis, no clubbing MUSCULOSKELETAL:  No deformity  SKIN: Warm and dry NEUROLOGIC:  Alert and oriented x 3, non-focal PSYCHIATRIC:  Normal affect, good insight  ASSESSMENT:    1. Primary hypertension   2. Morbid obesity (HCC)   3. Prediabetes     PLAN:     We had previously cut back on her antihypertensive medication due to syncope episode from low blood pressure.  Unfortunately this is not working for her.  So I like to increase her carvedilol to 12.5 mg daily.  Hold off on starting the hydrochlorothiazide for now.  Hyperlipidemia - continue with current statin medication.  The patient understands the need  to lose weight with diet and exercise. We have discussed specific strategies for this.  The patient is in agreement with the above plan. The patient left the office in stable condition.  The patient will follow up in 12 weeks or sooner if needed.  We will   Medication Adjustments/Labs and Tests Ordered: Current medicines are reviewed at length with the patient today.  Concerns regarding medicines are outlined above.  No orders of the defined types were placed in this encounter.  Meds ordered this encounter  Medications   carvedilol (COREG) 12.5 MG tablet    Sig: Take 1 tablet (12.5 mg total) by mouth 2 (two) times daily.    Dispense:  180 tablet    Refill:  3    Patient Instructions  Medication Instructions:  Your physician has recommended you make the following change in your medication:  INCREASE: Coreg 12.5 mg twice daily  Please take your blood pressure daily for 2 weeks and send in a MyChart message. Please include heart rates.   HOW TO TAKE YOUR BLOOD PRESSURE: Rest 5 minutes before taking your blood pressure. Don't smoke or drink caffeinated beverages for at least 30 minutes before. Take your blood pressure before (not after) you eat. Sit comfortably with your back supported and both feet on the floor (don't cross your legs). Elevate your arm to heart level on a table or a desk. Use the proper sized cuff. It should fit smoothly and snugly around your bare upper arm. There should be enough room to slip a fingertip under the cuff. The bottom edge of the cuff should be 1 inch above the crease of the elbow. Ideally, take 3 measurements at one sitting and record the average. *If you need a refill on your cardiac medications before your next appointment, please call your pharmacy*   Lab Work: None  Testing/Procedures: None   Follow-Up: At [facility], you and your health needs are our priority.  As part of our continuing mission to provide you with exceptional  heart care, we have created designated Provider Care Teams.  These Care Teams include your primary Cardiologist (physician) and Advanced Practice Providers (APPs -  Physician Assistants and Nurse Practitioners) who all work together to provide you with the care you need, when you need it.  We recommend signing up for the patient portal called "MyChart".  Sign up information is provided on this After Visit Summary.  MyChart is used to connect with patients for Virtual Visits (Telemedicine).  Patients are able to view lab/test results, encounter notes, upcoming appointments, etc.  Non-urgent messages can be sent to your provider as well.   To learn more about what you can do with MyChart, go to [email or web address].    Your next appointment:   12 week(s)  The format for your next appointment:   In Person  Provider:   Thomasene Ripple, DO     Other Instructions   Important Information About Sugar         Adopting a Healthy Lifestyle.  Know what a healthy weight is for you (roughly BMI <25) and aim to maintain this   Aim for 7+ servings of fruits and vegetables daily   65-80+ fluid ounces of water or unsweet tea for healthy kidneys   Limit to max 1 drink of alcohol per day; avoid smoking/tobacco   Limit animal fats in diet for cholesterol and heart health - choose grass fed whenever available   Avoid highly processed foods, and foods high in saturated/trans fats   Aim for low stress - take time to unwind and care for your mental health   Aim for 150 min of moderate intensity exercise weekly for heart health, and weights twice weekly for bone health   Aim for 7-9 hours of sleep daily   When it comes to diets, agreement about the perfect plan isnt easy to find, even among the experts. Experts at the [facility] [employer] developed an idea known as the Healthy Eating Plate. Just imagine a plate divided into logical, healthy portions.   The emphasis is on diet  quality:   Load up on vegetables and fruits - one-half of your plate: Aim for color and variety, and remember that potatoes dont count.   Go for whole grains - one-quarter of your plate: Whole wheat, barley, wheat berries, quinoa, oats, brown rice, and foods made with them. If you want pasta, go with whole wheat pasta.   Protein power - one-quarter of your plate: Fish, chicken, beans, and nuts are all healthy, versatile protein sources. Limit red meat.   The diet, however, does go beyond the plate, offering a few other suggestions.   Use healthy plant oils, such as olive, canola, soy, corn, sunflower and peanut. Check the labels, and avoid partially hydrogenated oil, which have unhealthy trans fats.   If youre thirsty, drink water. Coffee and tea are good in moderation, but skip sugary drinks and limit milk and dairy products to one or two daily servings.   The type of carbohydrate in the diet is more important than the amount. Some sources of carbohydrates, such as vegetables, fruits, whole grains, and beans-are healthier than others.   Finally, stay active  Signed, Thomasene Ripple, DO  10/24/2021 10:31 AM    [facility]

## 2021-10-26 ENCOUNTER — Other Ambulatory Visit: Payer: Self-pay | Admitting: Cardiology

## 2022-01-01 ENCOUNTER — Ambulatory Visit: Payer: BC Managed Care – PPO | Attending: Cardiology | Admitting: Cardiology

## 2022-01-01 ENCOUNTER — Encounter: Payer: Self-pay | Admitting: Cardiology

## 2022-01-01 VITALS — BP 150/98 | HR 68 | Ht 59.0 in | Wt 230.4 lb

## 2022-01-01 DIAGNOSIS — I4729 Other ventricular tachycardia: Secondary | ICD-10-CM

## 2022-01-01 DIAGNOSIS — I10 Essential (primary) hypertension: Secondary | ICD-10-CM | POA: Diagnosis not present

## 2022-01-01 MED ORDER — HYDROCHLOROTHIAZIDE 12.5 MG PO CAPS: 12.5000 mg | ORAL_CAPSULE | Freq: Every day | ORAL | 3 refills | Status: AC

## 2022-01-01 MED ORDER — CARVEDILOL 25 MG PO TABS: 25.0000 mg | ORAL_TABLET | Freq: Two times a day (BID) | ORAL | 3 refills | Status: AC

## 2022-01-01 NOTE — Progress Notes (Signed)
Cardiology Office Note:    Date:  01/01/2022   ID:  Brooke Trujillo, DOB 01/25/1971, MRN [id number]  PCP:  Adolph Pollack, FNP  Cardiologist:  Thomasene Ripple, DO  Electrophysiologist:  None   Referring MD: Adolph Pollack, FNP   " I am doing fine"  History of Present Illness:    Brooke Trujillo is a 51 y.o. female with a hx of prediabetes, hypertension, hyperlipidemia, morbid obesity, and SVT which was seen on ZIO monitor is here today for follow-up visit.  At her  visit on October 10, 2020 at that time we increased her carvedilol to 12.5 mg twice daily due to the NSVT and also to help control her blood pressure.  She maintain her hydrochlorothiazide.  I saw her on April 23, 2021 she had been hospitalized at [facility] at that time she had syncope episode due to low blood pressure.  Her blood pressure medications were held, she also had not been taking her Lipitor.  During the visit we stopped the hydrochlorothiazide and cut back on her carvedilol as her blood pressure was very low.   At her visit on October 23, 2021 we cut back on antihypertensive medication due to syncope event.  Unfortunately that was not working.  Had to increase her carvedilol to 12.5 mg twice daily we held off on starting the hydrochlorothiazide.  .  Past Medical History:  Diagnosis Date   High cholesterol    Hypertension     Past Surgical History:  Procedure Laterality Date   ABDOMINAL HYSTERECTOMY     CHOLECYSTECTOMY     TUBAL LIGATION      Current Medications: Current Meds  Medication Sig   atorvastatin (LIPITOR) 20 MG tablet Take 20 mg by mouth daily.   etodolac (LODINE) 400 MG tablet Take by mouth.   hydrochlorothiazide (MICROZIDE) 12.5 MG capsule Take 1 capsule (12.5 mg total) by mouth daily.   ibuprofen (ADVIL,MOTRIN) 200 MG tablet Take 200 mg by mouth every 6 (six) hours as needed for headache or mild pain.   meloxicam (MOBIC) 15 MG tablet Take 15 mg by mouth daily.    methocarbamol (ROBAXIN) 500 MG tablet Take 1 tablet (500 mg total) by mouth 2 (two) times daily.   montelukast (SINGULAIR) 10 MG tablet Take by mouth.   naproxen (EC NAPROSYN) 500 MG EC tablet Take 500 mg by mouth 2 (two) times daily.   potassium chloride SA (KLOR-CON M) 20 MEQ tablet Take 1 tablet (20 mEq total) by mouth daily.   Semaglutide, 1 MG/DOSE, (OZEMPIC, 1 MG/DOSE,) 4 MG/3ML SOPN    SUMAtriptan (IMITREX) 50 MG tablet 1 tablet (50 mg total).   tiZANidine (ZANAFLEX) 4 MG tablet Take by mouth.   [DISCONTINUED] carvedilol (COREG) 12.5 MG tablet Take 1 tablet (12.5 mg total) by mouth 2 (two) times daily.   [DISCONTINUED] NIFEdipine (PROCARDIA-XL/NIFEDICAL-XL) 30 MG 24 hr tablet Take 30 mg by mouth daily.     Allergies:   Amlodipine, Aspirin, Olmesartan, Contrast media [iodinated contrast media], Lisinopril, and Iodine   Social History   Socioeconomic History   Marital status: Single    Spouse name: Not on file   Number of children: Not on file   Years of education: Not on file   Highest education level: Not on file  Occupational History   Not on file  Tobacco Use   Smoking status: Never   Smokeless tobacco: Never  Vaping Use   Vaping Use: Never used  Substance and Sexual Activity  Alcohol use: No   Drug use: No   Sexual activity: Never    Birth control/protection: Surgical  Other Topics Concern   Not on file  Social History Narrative   Not on file   Social Determinants of Health   Financial Resource Strain: Not on file  Food Insecurity: Not on file  Transportation Needs: Not on file  Physical Activity: Not on file  Stress: Not on file  Social Connections: Not on file     Family History: The patient's family history includes Colon cancer in her mother; Congestive Heart Failure (age of onset: 78) in her mother; Coronary artery disease in her mother; Diabetes in her mother; Healthy in her brother, brother, father, sister, and sister; Hypertension in her brother and  brother.  ROS:   Review of Systems  Constitution: Negative for decreased appetite, fever and weight gain.  HENT: Negative for congestion, ear discharge, hoarse voice and sore throat.   Eyes: Negative for discharge, redness, vision loss in right eye and visual halos.  Cardiovascular: Negative for chest pain, dyspnea on exertion, leg swelling, orthopnea and palpitations.  Respiratory: Negative for cough, hemoptysis, shortness of breath and snoring.   Endocrine: Negative for heat intolerance and polyphagia.  Hematologic/Lymphatic: Negative for bleeding problem. Does not bruise/bleed easily.  Skin: Negative for flushing, nail changes, rash and suspicious lesions.  Musculoskeletal: Negative for arthritis, joint pain, muscle cramps, myalgias, neck pain and stiffness.  Gastrointestinal: Negative for abdominal pain, bowel incontinence, diarrhea and excessive appetite.  Genitourinary: Negative for decreased libido, genital sores and incomplete emptying.  Neurological: Negative for brief paralysis, focal weakness, headaches and loss of balance.  Psychiatric/Behavioral: Negative for altered mental status, depression and suicidal ideas.  Allergic/Immunologic: Negative for HIV exposure and persistent infections.    EKGs/Labs/Other Studies Reviewed:    The following studies were reviewed today:   EKG: Sinus rhythm, heart rate 68 bpm.  Echocardiogram done on February 05, 2021 and [facility] PROCEDURE  A complete two-dimensional transthoracic echocardiogram was performed  (2D, M-mode, spectral and color flow Doppler). Image Quality:  Technically adequate.  -  SUMMARY  Left ventricular systolic function is normal.  LV ejection fraction = 60-65%.  There is mild tricuspid regurgitation.  There is no comparison study available.   -  FINDINGS:  LEFT VENTRICLE  The left ventricular size is normal. There is normal left ventricular  wall thickness. Left  ventricular systolic function is normal. LV  ejection fraction = 60-65%. Normal left ventricular diastolic function  and left atrial pressure. The left ventricular wall motion is normal.   -  RIGHT VENTRICLE  The right ventricle is normal in size and function.   LEFT ATRIUM  The left atrial size is normal.   RIGHT ATRIUM  Right atrial size is normal.  -  AORTIC VALVE  The aortic valve is trileaflet. The aortic valve is normal in  structure and function. There is no aortic stenosis. There is no  aortic regurgitation.  -  MITRAL VALVE  The mitral valve leaflets appear normal. There is trace mitral  regurgitation.  -  TRICUSPID VALVE  Structurally normal tricuspid valve. There is mild tricuspid  regurgitation. No pulmonary hypertension. Estimated right ventricular  systolic pressure is 37 mmHg.  -  PULMONIC VALVE  The pulmonic valve is not well visualized. Mild pulmonic valvular  regurgitation.  -  ARTERIES  The aortic sinus is normal size.  -  VENOUS  Pulmonary venous flow pattern is normal. IVC  size was normal.  -  EFFUSION  There is no pericardial effusion.  -   CTA chest February 05, 2021 IMPRESSION:  1. No evidence of a pulmonary embolism.  2. No acute findings.    Transthoracic echocardiogram July 27, 2020 IMPRESSIONS     1. Left ventricular ejection fraction, by estimation, is 60 to 65%. The  left ventricle has normal function. The left ventricle has no regional  wall motion abnormalities. Left ventricular diastolic parameters were  normal.   2. Right ventricular systolic function is normal. The right ventricular  size is normal.   3. The mitral valve is normal in structure. No evidence of mitral valve  regurgitation. No evidence of mitral stenosis.   4. The aortic valve is tricuspid. Aortic valve regurgitation is not  visualized. No aortic stenosis is present.   5. The inferior vena cava is normal in size with greater than 50%  respiratory variability,  suggesting right atrial pressure of 3 mmHg.   Comparison(s): No prior Echocardiogram.   Conclusion(s)/Recommendation(s): Normal biventricular function without  evidence of hemodynamically significant valvular heart disease.   FINDINGS   Left Ventricle: Left ventricular ejection fraction, by estimation, is 60  to 65%. The left ventricle has normal function. The left ventricle has no  regional wall motion abnormalities. The left ventricular internal cavity  size was normal in size. There is   no left ventricular hypertrophy. Left ventricular diastolic parameters  were normal.   Right Ventricle: The right ventricular size is normal. No increase in  right ventricular wall thickness. Right ventricular systolic function is  normal.   Left Atrium: Left atrial size was normal in size.   Right Atrium: Right atrial size was normal in size.   Pericardium: There is no evidence of pericardial effusion.   Mitral Valve: The mitral valve is normal in structure. No evidence of  mitral valve regurgitation. No evidence of mitral valve stenosis.   Tricuspid Valve: The tricuspid valve is normal in structure. Tricuspid  valve regurgitation is trivial. No evidence of tricuspid stenosis.   Aortic Valve: The aortic valve is tricuspid. Aortic valve regurgitation is  not visualized. No aortic stenosis is present.   Pulmonic Valve: The pulmonic valve was not well visualized. Pulmonic valve  regurgitation is trivial. No evidence of pulmonic stenosis.   Aorta: The aortic root, ascending aorta and aortic arch are all  structurally normal, with no evidence of dilitation or obstruction.   Venous: The inferior vena cava is normal in size with greater than 50%  respiratory variability, suggesting right atrial pressure of 3 mmHg.   IAS/Shunts: The atrial septum is grossly normal.        Zio monitor July 24, 2020 Patch Wear Time:  7 days and 5 hours starting July 10, 2020. Indications: Palpitations    Patient had a minimal HR of 53 bpm, maximum HR of 141 bpm, and average HR of 82 bpm.   Predominant underlying rhythm was Sinus Rhythm.   1 run of Ventricular Tachycardia occurred lasting 5 beats with a max rate of 141 bpm (average 121 bpm).   Premature atrial complexes were rare. Premature ventricular complexes were rare.   Symptoms associated with premature ventricular complexes.   No pauses, no supraventricular tachycardia, no atrial fibrillation.   Conclusion: This study is remarkable for 5 beat run of nonsustained ventricular tachycardia.    Recent Labs: 01/11/2021: BUN 11; Creatinine, Ser 0.85; Hemoglobin 11.9; Platelets 266; Potassium 2.9; Sodium 138  Recent Lipid Panel No results  found for: "CHOL", "TRIG", "HDL", "CHOLHDL", "VLDL", "LDLCALC", "LDLDIRECT"  Physical Exam:    VS:  BP (!) 148/108 (BP Location: Right Arm, Patient Position: Sitting, Cuff Size: Large)   Pulse 68   Ht 4\' 11"  (1.499 m)   Wt 230 lb 6.4 oz (104.5 kg)   SpO2 98%   BMI 46.54 kg/m     Wt Readings from Last 3 Encounters:  01/01/22 230 lb 6.4 oz (104.5 kg)  10/23/21 230 lb 6.4 oz (104.5 kg)  04/23/21 235 lb 9.6 oz (106.9 kg)     GEN: Well nourished, well developed in no acute distress HEENT: Normal NECK: No JVD; No carotid bruits LYMPHATICS: No lymphadenopathy CARDIAC: S1S2 noted,RRR, no murmurs, rubs, gallops RESPIRATORY:  Clear to auscultation without rales, wheezing or rhonchi  ABDOMEN: Soft, non-tender, non-distended, +bowel sounds, no guarding. EXTREMITIES: No edema, No cyanosis, no clubbing MUSCULOSKELETAL:  No deformity  SKIN: Warm and dry NEUROLOGIC:  Alert and oriented x 3, non-focal PSYCHIATRIC:  Normal affect, good insight  ASSESSMENT:    1. Essential hypertension      PLAN:    She is hypertensive in the office today.  I reviewed her blood pressure they are still elevated at home.  She was started on nifedipine by her PCP is giving her significant hot flashes and  headaches.  We will stop this medication.  Increase her Coreg to 25 mg daily.  Bring back her hydrochlorothiazide starting a low-dose 12.5 mg daily.   Hyperlipidemia - continue with current statin medication.  The patient understands the need to lose weight with diet and exercise. We have discussed specific strategies for this.  We will have her see our pharmacist hypertension team in 6 weeks.  The patient is in agreement with the above plan. The patient left the office in stable condition.  The patient will follow up in 6 months or sooner if needed.  We will   Medication Adjustments/Labs and Tests Ordered: Current medicines are reviewed at length with the patient today.  Concerns regarding medicines are outlined above.  Orders Placed This Encounter  Procedures   EKG 12-Lead   Meds ordered this encounter  Medications   hydrochlorothiazide (MICROZIDE) 12.5 MG capsule    Sig: Take 1 capsule (12.5 mg total) by mouth daily.    Dispense:  90 capsule    Refill:  3   carvedilol (COREG) 25 MG tablet    Sig: Take 1 tablet (25 mg total) by mouth 2 (two) times daily.    Dispense:  180 tablet    Refill:  3    Patient Instructions  Medication Instructions:  Your physician has recommended you make the following change in your medication: STOP: nifedipine START: HCTZ 12.5mg  daily INCREASE: Coreg 25mg  Twice Daily.   *If you need a refill on your cardiac medications before your next appointment, please call your pharmacy*   Lab Work: NONE If you have labs (blood work) drawn today and your tests are completely normal, you will receive your results only by: MyChart Message (if you have MyChart) OR A paper copy in the mail If you have any lab test that is abnormal or we need to change your treatment, we will call you to review the results.   Testing/Procedures: NONE   Follow-Up: At [facility], you and your health needs are our priority.  As part of our continuing mission to  provide you with exceptional heart care, we have created designated Provider Care Teams.  These Care Teams include  your primary Cardiologist (physician) and Advanced Practice Providers (APPs -  Physician Assistants and Nurse Practitioners) who all work together to provide you with the care you need, when you need it.  We recommend signing up for the patient portal called "MyChart".  Sign up information is provided on this After Visit Summary.  MyChart is used to connect with patients for Virtual Visits (Telemedicine).  Patients are able to view lab/test results, encounter notes, upcoming appointments, etc.  Non-urgent messages can be sent to your provider as well.   To learn more about what you can do with MyChart, go to [email or web address].    Your next appointment:   6 month(s) also please schedule patient for a Pharmacy HTN F/U in 6 weeks   The format for your next appointment:   In Person  Provider:   Thomasene RippleKardie Lalanya Rufener, DO     Adopting a Healthy Lifestyle.  Know what a healthy weight is for you (roughly BMI <25) and aim to maintain this   Aim for 7+ servings of fruits and vegetables daily   65-80+ fluid ounces of water or unsweet tea for healthy kidneys   Limit to max 1 drink of alcohol per day; avoid smoking/tobacco   Limit animal fats in diet for cholesterol and heart health - choose grass fed whenever available   Avoid highly processed foods, and foods high in saturated/trans fats   Aim for low stress - take time to unwind and care for your mental health   Aim for 150 min of moderate intensity exercise weekly for heart health, and weights twice weekly for bone health   Aim for 7-9 hours of sleep daily   When it comes to diets, agreement about the perfect plan isnt easy to find, even among the experts. Experts at the [employer] developed an idea known as the Healthy Eating Plate. Just imagine a plate divided into logical, healthy portions.   The emphasis  is on diet quality:   Load up on vegetables and fruits - one-half of your plate: Aim for color and variety, and remember that potatoes dont count.   Go for whole grains - one-quarter of your plate: Whole wheat, barley, wheat berries, quinoa, oats, brown rice, and foods made with them. If you want pasta, go with whole wheat pasta.   Protein power - one-quarter of your plate: Fish, chicken, beans, and nuts are all healthy, versatile protein sources. Limit red meat.   The diet, however, does go beyond the plate, offering a few other suggestions.   Use healthy plant oils, such as olive, canola, soy, corn, sunflower and peanut. Check the labels, and avoid partially hydrogenated oil, which have unhealthy trans fats.   If youre thirsty, drink water. Coffee and tea are good in moderation, but skip sugary drinks and limit milk and dairy products to one or two daily servings.   The type of carbohydrate in the diet is more important than the amount. Some sources of carbohydrates, such as vegetables, fruits, whole grains, and beans-are healthier than others.   Finally, stay active  Signed, Thomasene RippleKardie Nayeliz Hipp, DO  01/01/2022 9:13 AM    [facility]

## 2022-01-01 NOTE — Patient Instructions (Addendum)
Medication Instructions:  Your physician has recommended you make the following change in your medication: STOP: nifedipine START: HCTZ 12.5mg  daily INCREASE: Coreg 25mg  Twice Daily.   *If you need a refill on your cardiac medications before your next appointment, please call your pharmacy*   Lab Work: NONE If you have labs (blood work) drawn today and your tests are completely normal, you will receive your results only by: MyChart Message (if you have MyChart) OR A paper copy in the mail If you have any lab test that is abnormal or we need to change your treatment, we will call you to review the results.   Testing/Procedures: NONE   Follow-Up: At [facility], you and your health needs are our priority.  As part of our continuing mission to provide you with exceptional heart care, we have created designated Provider Care Teams.  These Care Teams include your primary Cardiologist (physician) and Advanced Practice Providers (APPs -  Physician Assistants and Nurse Practitioners) who all work together to provide you with the care you need, when you need it.  We recommend signing up for the patient portal called "MyChart".  Sign up information is provided on this After Visit Summary.  MyChart is used to connect with patients for Virtual Visits (Telemedicine).  Patients are able to view lab/test results, encounter notes, upcoming appointments, etc.  Non-urgent messages can be sent to your provider as well.   To learn more about what you can do with MyChart, go to [facility].    Your next appointment:   6 month(s) also please schedule patient for a Pharmacy HTN F/U in 6 weeks   The format for your next appointment:   In Person  Provider:   [email or web address], DO

## 2022-02-12 ENCOUNTER — Ambulatory Visit: Payer: BC Managed Care – PPO

## 2022-07-02 ENCOUNTER — Ambulatory Visit: Payer: BC Managed Care – PPO | Admitting: Cardiology

## 2022-10-15 ENCOUNTER — Ambulatory Visit: Payer: BC Managed Care – PPO | Attending: Cardiology | Admitting: Cardiology
